# Patient Record
Sex: Female | Born: 1935 | Race: White | Hispanic: No | Marital: Married | Smoking: Former smoker
Health system: Southern US, Community
[De-identification: ages and names within clinical notes are randomized; demographics above are authoritative.]

## PROBLEM LIST (undated history)

## (undated) DIAGNOSIS — M797 Fibromyalgia: Secondary | ICD-10-CM

## (undated) DIAGNOSIS — I10 Essential (primary) hypertension: Secondary | ICD-10-CM

## (undated) DIAGNOSIS — R918 Other nonspecific abnormal finding of lung field: Secondary | ICD-10-CM

## (undated) DIAGNOSIS — F32A Depression, unspecified: Secondary | ICD-10-CM

## (undated) DIAGNOSIS — M1990 Unspecified osteoarthritis, unspecified site: Secondary | ICD-10-CM

## (undated) DIAGNOSIS — E119 Type 2 diabetes mellitus without complications: Secondary | ICD-10-CM

## (undated) DIAGNOSIS — I2510 Atherosclerotic heart disease of native coronary artery without angina pectoris: Secondary | ICD-10-CM

## (undated) DIAGNOSIS — E785 Hyperlipidemia, unspecified: Secondary | ICD-10-CM

## (undated) DIAGNOSIS — F329 Major depressive disorder, single episode, unspecified: Secondary | ICD-10-CM

## (undated) DIAGNOSIS — C50919 Malignant neoplasm of unspecified site of unspecified female breast: Secondary | ICD-10-CM

## (undated) HISTORY — DX: Atherosclerotic heart disease of native coronary artery without angina pectoris: I25.10

## (undated) HISTORY — PX: LUNG REMOVAL, PARTIAL: SHX233

## (undated) HISTORY — PX: CHOLECYSTECTOMY: SHX55

## (undated) HISTORY — PX: REPLACEMENT TOTAL KNEE: SUR1224

## (undated) HISTORY — DX: Essential (primary) hypertension: I10

## (undated) HISTORY — PX: MASTECTOMY: SHX3

## (undated) HISTORY — DX: Other nonspecific abnormal finding of lung field: R91.8

## (undated) HISTORY — DX: Malignant neoplasm of unspecified site of unspecified female breast: C50.919

## (undated) HISTORY — DX: Hyperlipidemia, unspecified: E78.5

---

## 2003-10-02 ENCOUNTER — Ambulatory Visit (HOSPITAL_COMMUNITY): Admission: RE | Admit: 2003-10-02 | Discharge: 2003-10-02 | Payer: Self-pay | Admitting: Neurosurgery

## 2003-10-04 ENCOUNTER — Inpatient Hospital Stay (HOSPITAL_COMMUNITY): Admission: EM | Admit: 2003-10-04 | Discharge: 2003-10-07 | Payer: Self-pay | Admitting: Emergency Medicine

## 2003-10-20 ENCOUNTER — Inpatient Hospital Stay (HOSPITAL_COMMUNITY): Admission: RE | Admit: 2003-10-20 | Discharge: 2003-10-21 | Payer: Self-pay | Admitting: Neurosurgery

## 2003-11-22 ENCOUNTER — Inpatient Hospital Stay (HOSPITAL_COMMUNITY): Admission: RE | Admit: 2003-11-22 | Discharge: 2003-11-28 | Payer: Self-pay | Admitting: Orthopedic Surgery

## 2004-12-11 ENCOUNTER — Ambulatory Visit: Payer: Self-pay | Admitting: Internal Medicine

## 2004-12-16 ENCOUNTER — Ambulatory Visit: Payer: Self-pay | Admitting: Internal Medicine

## 2004-12-16 ENCOUNTER — Ambulatory Visit (HOSPITAL_COMMUNITY): Admission: RE | Admit: 2004-12-16 | Discharge: 2004-12-16 | Payer: Self-pay | Admitting: Internal Medicine

## 2004-12-18 ENCOUNTER — Ambulatory Visit: Admission: RE | Admit: 2004-12-18 | Discharge: 2004-12-18 | Payer: Self-pay | Admitting: Internal Medicine

## 2005-01-30 ENCOUNTER — Encounter (INDEPENDENT_AMBULATORY_CARE_PROVIDER_SITE_OTHER): Payer: Self-pay | Admitting: Specialist

## 2005-01-30 ENCOUNTER — Inpatient Hospital Stay (HOSPITAL_COMMUNITY): Admission: RE | Admit: 2005-01-30 | Discharge: 2005-02-07 | Payer: Self-pay | Admitting: Thoracic Surgery

## 2005-02-04 ENCOUNTER — Ambulatory Visit: Payer: Self-pay | Admitting: Internal Medicine

## 2005-02-12 ENCOUNTER — Encounter: Admission: RE | Admit: 2005-02-12 | Discharge: 2005-02-12 | Payer: Self-pay | Admitting: Thoracic Surgery

## 2005-02-26 ENCOUNTER — Encounter: Admission: RE | Admit: 2005-02-26 | Discharge: 2005-02-26 | Payer: Self-pay | Admitting: Thoracic Surgery

## 2005-03-26 ENCOUNTER — Encounter: Admission: RE | Admit: 2005-03-26 | Discharge: 2005-03-26 | Payer: Self-pay | Admitting: Thoracic Surgery

## 2005-05-21 ENCOUNTER — Ambulatory Visit: Payer: Self-pay | Admitting: Internal Medicine

## 2005-05-21 ENCOUNTER — Encounter: Admission: RE | Admit: 2005-05-21 | Discharge: 2005-05-21 | Payer: Self-pay | Admitting: Thoracic Surgery

## 2005-08-20 ENCOUNTER — Encounter: Admission: RE | Admit: 2005-08-20 | Discharge: 2005-08-20 | Payer: Self-pay | Admitting: Thoracic Surgery

## 2005-11-20 ENCOUNTER — Ambulatory Visit: Payer: Self-pay | Admitting: Internal Medicine

## 2005-11-26 LAB — CBC WITH DIFFERENTIAL/PLATELET
BASO%: 0.7 % (ref 0.0–2.0)
Basophils Absolute: 0.1 10*3/uL (ref 0.0–0.1)
EOS%: 1.9 % (ref 0.0–7.0)
Eosinophils Absolute: 0.2 10*3/uL (ref 0.0–0.5)
HCT: 44.7 % (ref 34.8–46.6)
HGB: 14.7 g/dL (ref 11.6–15.9)
LYMPH%: 33 % (ref 14.0–48.0)
MCH: 27.5 pg (ref 26.0–34.0)
MCHC: 33 g/dL (ref 32.0–36.0)
MCV: 83.5 fL (ref 81.0–101.0)
MONO#: 0.7 10*3/uL (ref 0.1–0.9)
MONO%: 5.5 % (ref 0.0–13.0)
NEUT#: 7.1 10*3/uL — ABNORMAL HIGH (ref 1.5–6.5)
NEUT%: 58.9 % (ref 39.6–76.8)
Platelets: 249 10*3/uL (ref 145–400)
RBC: 5.36 10*6/uL — ABNORMAL HIGH (ref 3.70–5.32)
RDW: 15.7 % — ABNORMAL HIGH (ref 11.3–14.5)
WBC: 12.1 10*3/uL — ABNORMAL HIGH (ref 3.9–10.0)
lymph#: 4 10*3/uL — ABNORMAL HIGH (ref 0.9–3.3)

## 2005-11-26 LAB — COMPREHENSIVE METABOLIC PANEL
ALT: 16 U/L (ref 0–40)
Albumin: 4.1 g/dL (ref 3.5–5.2)
BUN: 19 mg/dL (ref 6–23)
Chloride: 105 mEq/L (ref 96–112)
Glucose, Bld: 215 mg/dL — ABNORMAL HIGH (ref 70–99)

## 2005-12-03 ENCOUNTER — Encounter: Admission: RE | Admit: 2005-12-03 | Discharge: 2005-12-03 | Payer: Self-pay | Admitting: Thoracic Surgery

## 2006-05-12 ENCOUNTER — Ambulatory Visit: Payer: Self-pay | Admitting: Ophthalmology

## 2006-05-17 ENCOUNTER — Ambulatory Visit: Payer: Self-pay | Admitting: Internal Medicine

## 2006-05-19 ENCOUNTER — Ambulatory Visit: Payer: Self-pay | Admitting: Ophthalmology

## 2006-05-20 LAB — COMPREHENSIVE METABOLIC PANEL
ALT: 13 U/L (ref 0–35)
CO2: 24 mEq/L (ref 19–32)
Chloride: 102 mEq/L (ref 96–112)
Glucose, Bld: 366 mg/dL — ABNORMAL HIGH (ref 70–99)
Sodium: 139 mEq/L (ref 135–145)
Total Bilirubin: 0.5 mg/dL (ref 0.3–1.2)

## 2006-05-20 LAB — CBC WITH DIFFERENTIAL/PLATELET
Eosinophils Absolute: 0.3 10*3/uL (ref 0.0–0.5)
HGB: 14.8 g/dL (ref 11.6–15.9)
LYMPH%: 34.7 % (ref 14.0–48.0)
MCH: 28.3 pg (ref 26.0–34.0)
MCHC: 33.2 g/dL (ref 32.0–36.0)
MONO#: 0.5 10*3/uL (ref 0.1–0.9)
MONO%: 5.2 % (ref 0.0–13.0)
NEUT%: 56.6 % (ref 39.6–76.8)
Platelets: 209 10*3/uL (ref 145–400)
RBC: 5.23 10*6/uL (ref 3.70–5.32)

## 2006-05-26 ENCOUNTER — Encounter: Admission: RE | Admit: 2006-05-26 | Discharge: 2006-05-26 | Payer: Self-pay | Admitting: Internal Medicine

## 2006-06-25 IMAGING — CR DG CHEST 2V
2 series · 2 of 2 positions shown · non-contrast
Comparison: 02/04/05.

CLINICAL DATA: Shortness of breath. 
 CHEST - 2 VIEW:

[view not recorded (1 of 2)]
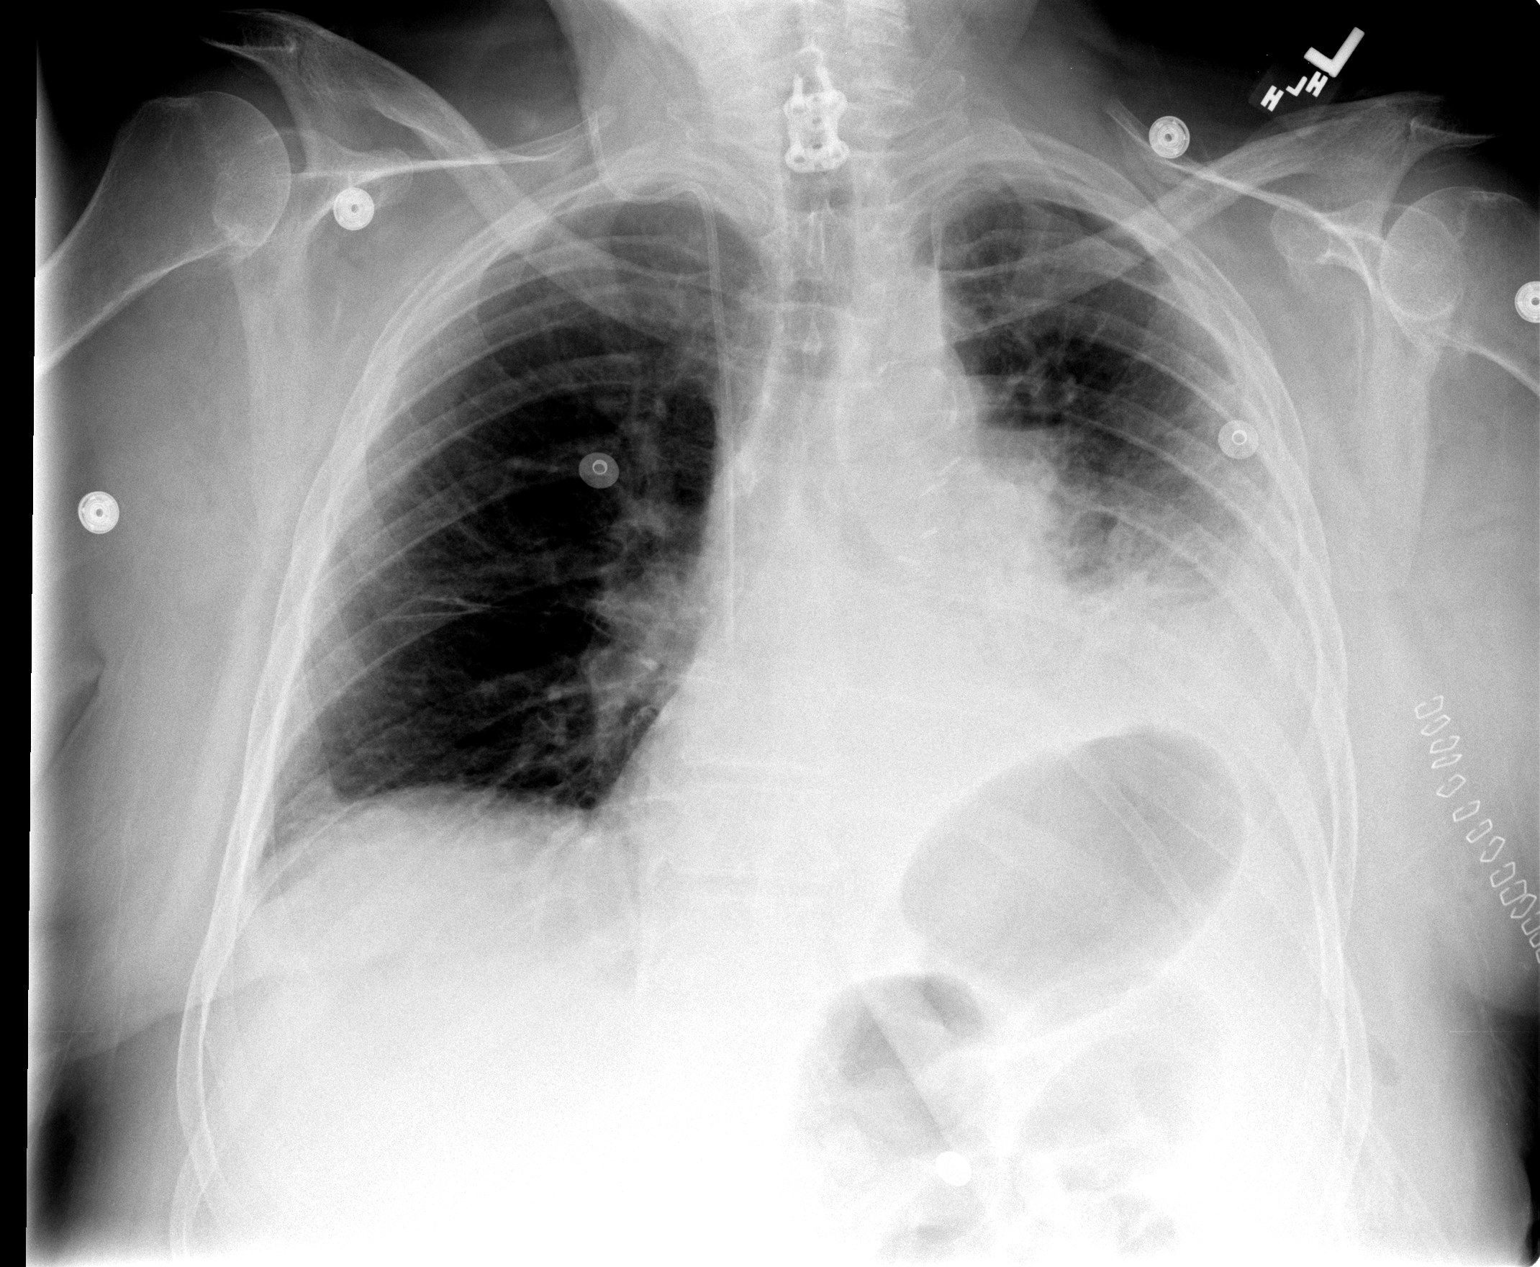

[view not recorded (2 of 2)]
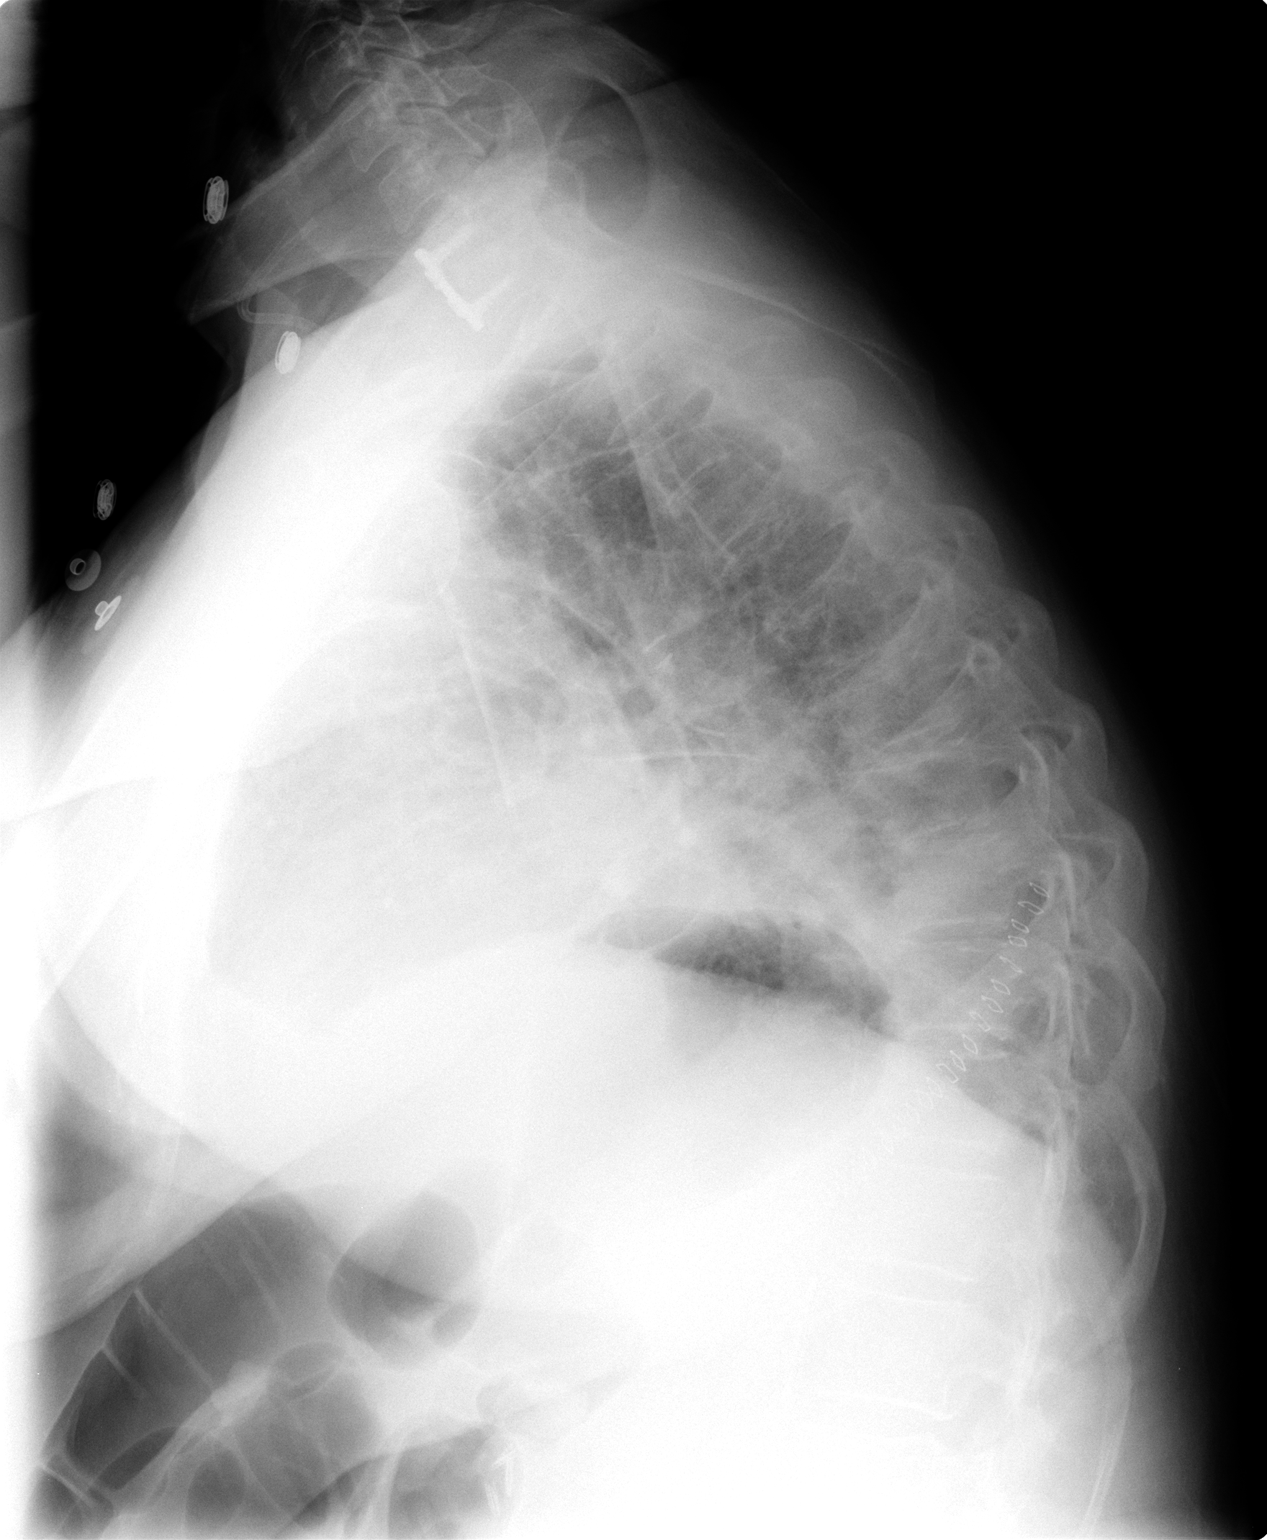

[2 of 2 positions shown; findings below may reference images not displayed]

Post surgical changes secondary to left thoracotomy.  Left hydropneumothorax has decreased. There is improved aeration in left lung.  Mild interstitial edema.  Central catheter tip remains at the SVC/right atrial junction.
IMPRESSION: Decreased left hydropneumothorax with improved aeration.

## 2006-06-27 IMAGING — CR DG CHEST 1V PORT
1 series · 1 of 1 positions shown · non-contrast
Comparison: 02/06/05

CLINICAL DATA: Lung lesion
 PORTABLE CHEST ? ONE VIEW:  02/07/05

[view not recorded]
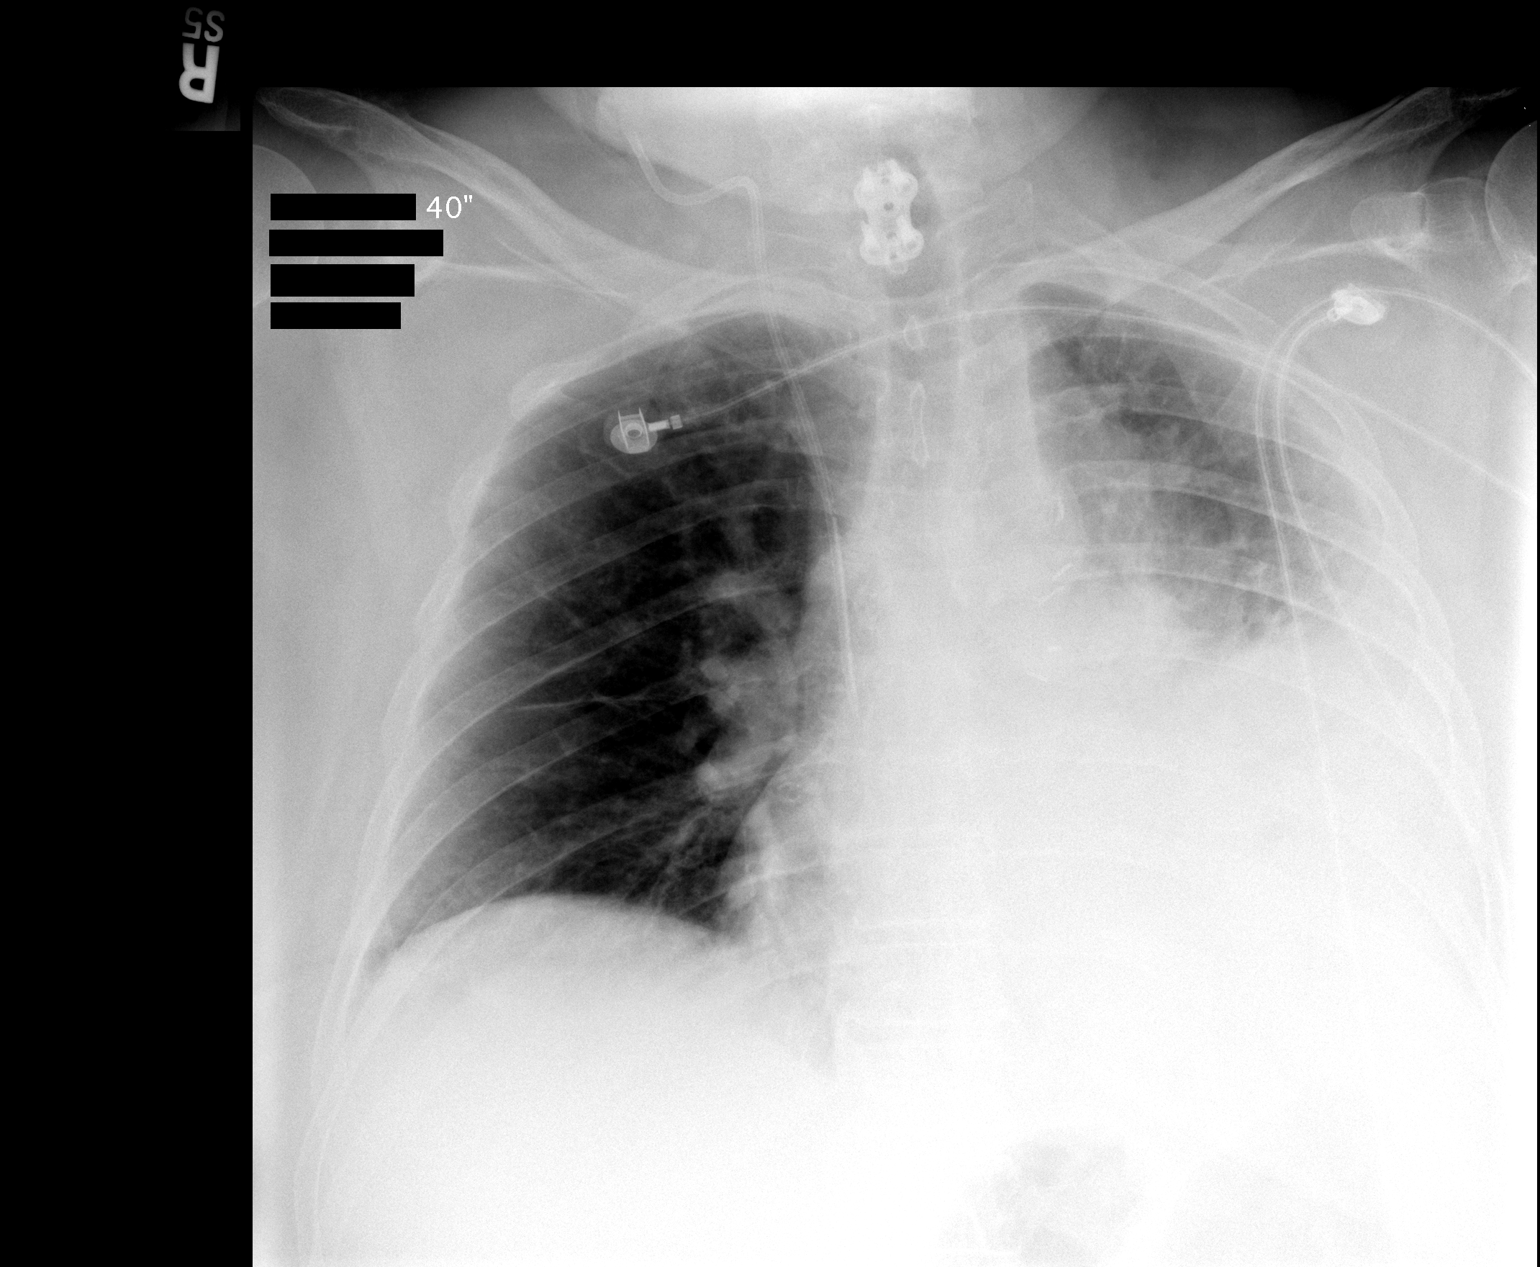

[1 of 1 positions shown; findings below may reference images not displayed]

FINDINGS: There has been an interval increase in the patient?s left effusion and air space disease.  Previously seen small pneumothorax is not visualized on today?s study.  The right lung is clear.
IMPRESSION: Increasing left effusion and air space disease.  Pneumothorax not visualized today.

## 2006-07-10 ENCOUNTER — Ambulatory Visit: Payer: Self-pay | Admitting: Internal Medicine

## 2006-07-15 ENCOUNTER — Ambulatory Visit (HOSPITAL_COMMUNITY): Admission: RE | Admit: 2006-07-15 | Discharge: 2006-07-15 | Payer: Self-pay | Admitting: Internal Medicine

## 2006-07-15 LAB — CBC WITH DIFFERENTIAL/PLATELET
BASO%: 0.7 % (ref 0.0–2.0)
EOS%: 1.9 % (ref 0.0–7.0)
LYMPH%: 28.9 % (ref 14.0–48.0)
MCH: 28.2 pg (ref 26.0–34.0)
MCHC: 33.3 g/dL (ref 32.0–36.0)
MCV: 84.8 fL (ref 81.0–101.0)
MONO%: 4.6 % (ref 0.0–13.0)
NEUT#: 6.6 10*3/uL — ABNORMAL HIGH (ref 1.5–6.5)
Platelets: 210 10*3/uL (ref 145–400)
RBC: 5.04 10*6/uL (ref 3.70–5.32)
RDW: 15.4 % — ABNORMAL HIGH (ref 11.3–14.5)

## 2006-07-15 LAB — COMPREHENSIVE METABOLIC PANEL
AST: 11 U/L (ref 0–37)
Albumin: 4.1 g/dL (ref 3.5–5.2)
Alkaline Phosphatase: 66 U/L (ref 39–117)
Glucose, Bld: 157 mg/dL — ABNORMAL HIGH (ref 70–99)
Potassium: 4.5 mEq/L (ref 3.5–5.3)
Sodium: 141 mEq/L (ref 135–145)
Total Bilirubin: 0.5 mg/dL (ref 0.3–1.2)
Total Protein: 7.3 g/dL (ref 6.0–8.3)

## 2006-07-30 ENCOUNTER — Ambulatory Visit: Payer: Self-pay | Admitting: Ophthalmology

## 2006-08-04 ENCOUNTER — Ambulatory Visit: Payer: Self-pay | Admitting: Ophthalmology

## 2006-08-13 IMAGING — CR DG CHEST 2V
2 series · 2 of 2 positions shown · non-contrast
Comparison: 02/26/05.

CLINICAL DATA: Lung lesion with recent left lung surgery.
 TWO VIEW CHEST:

[w chest pa]
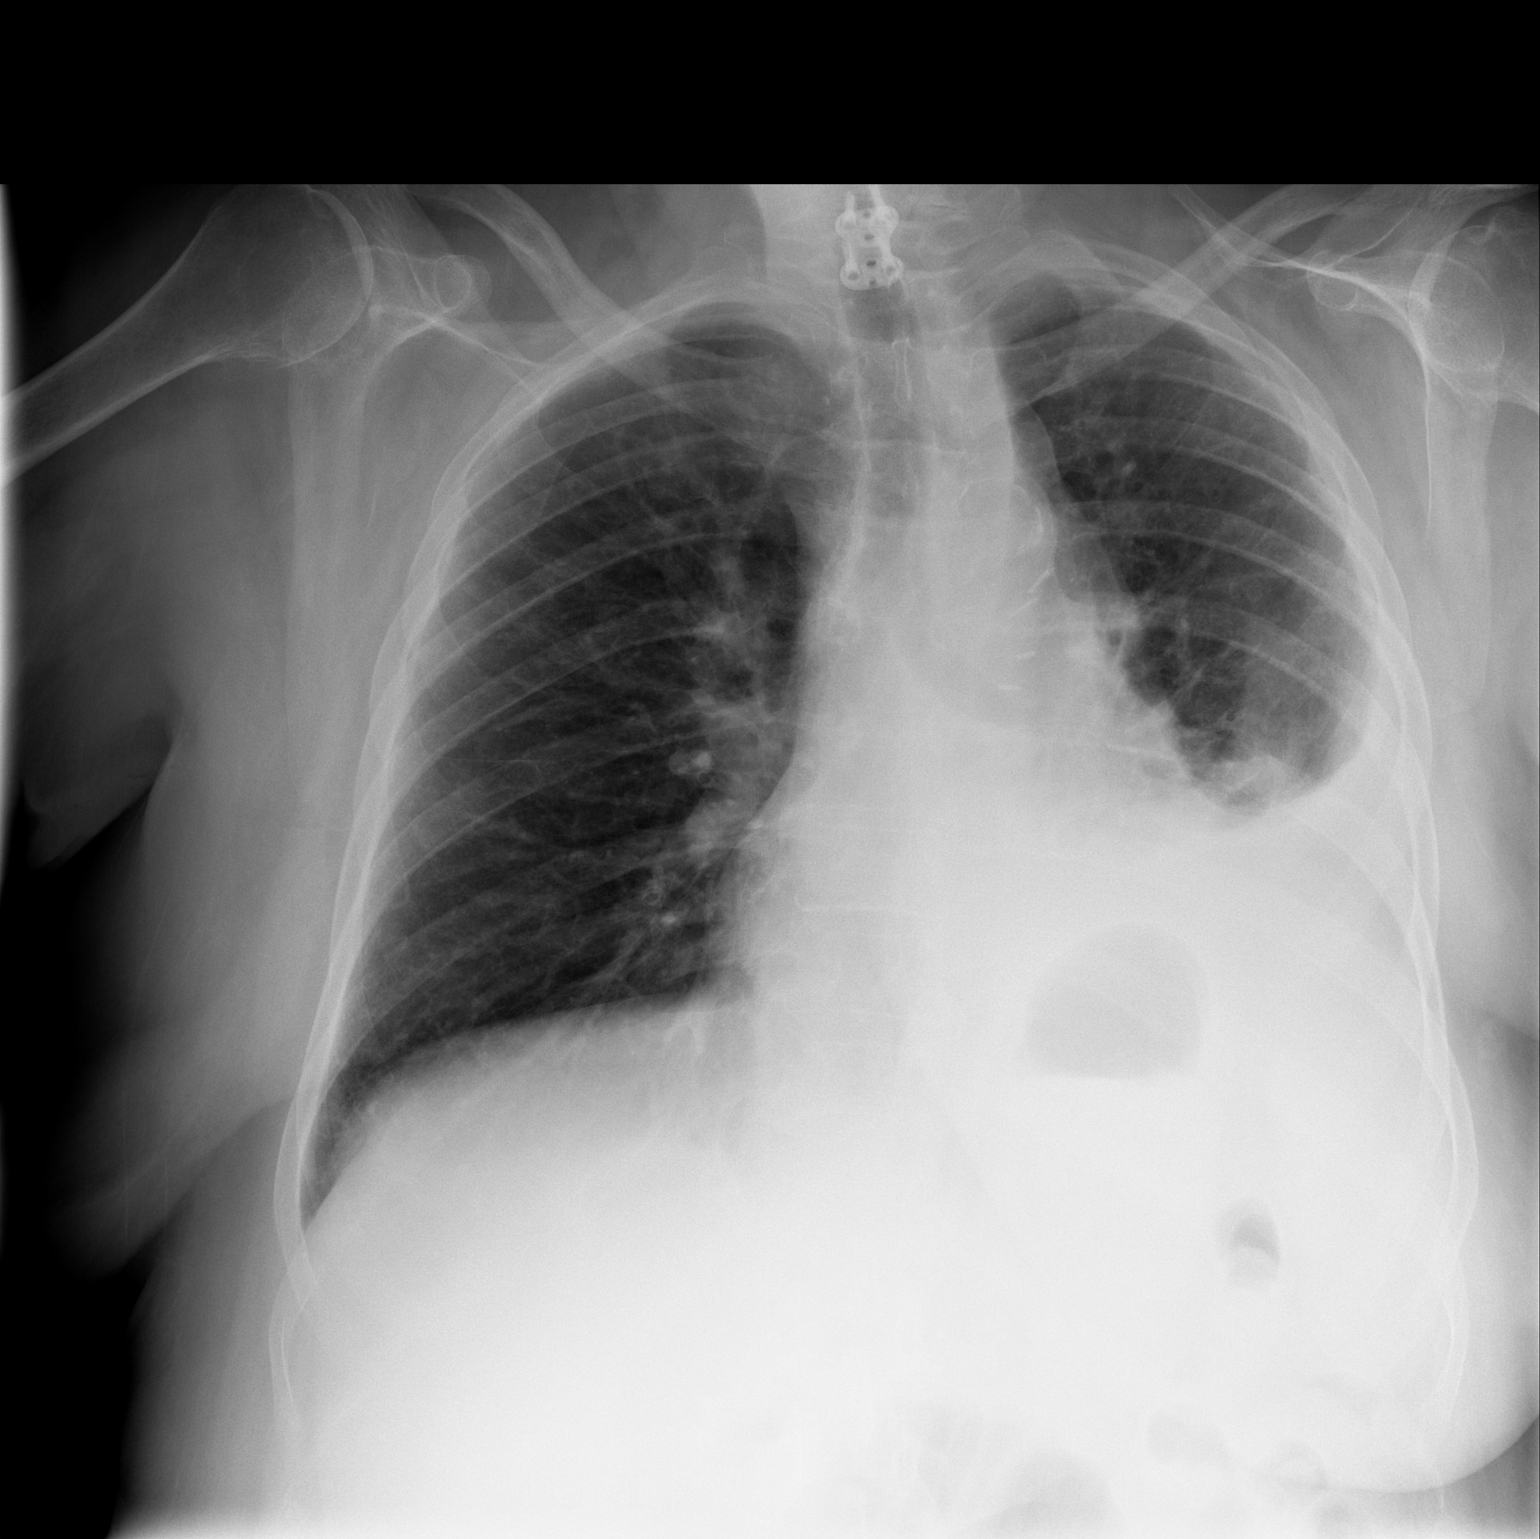

[w chest lat]
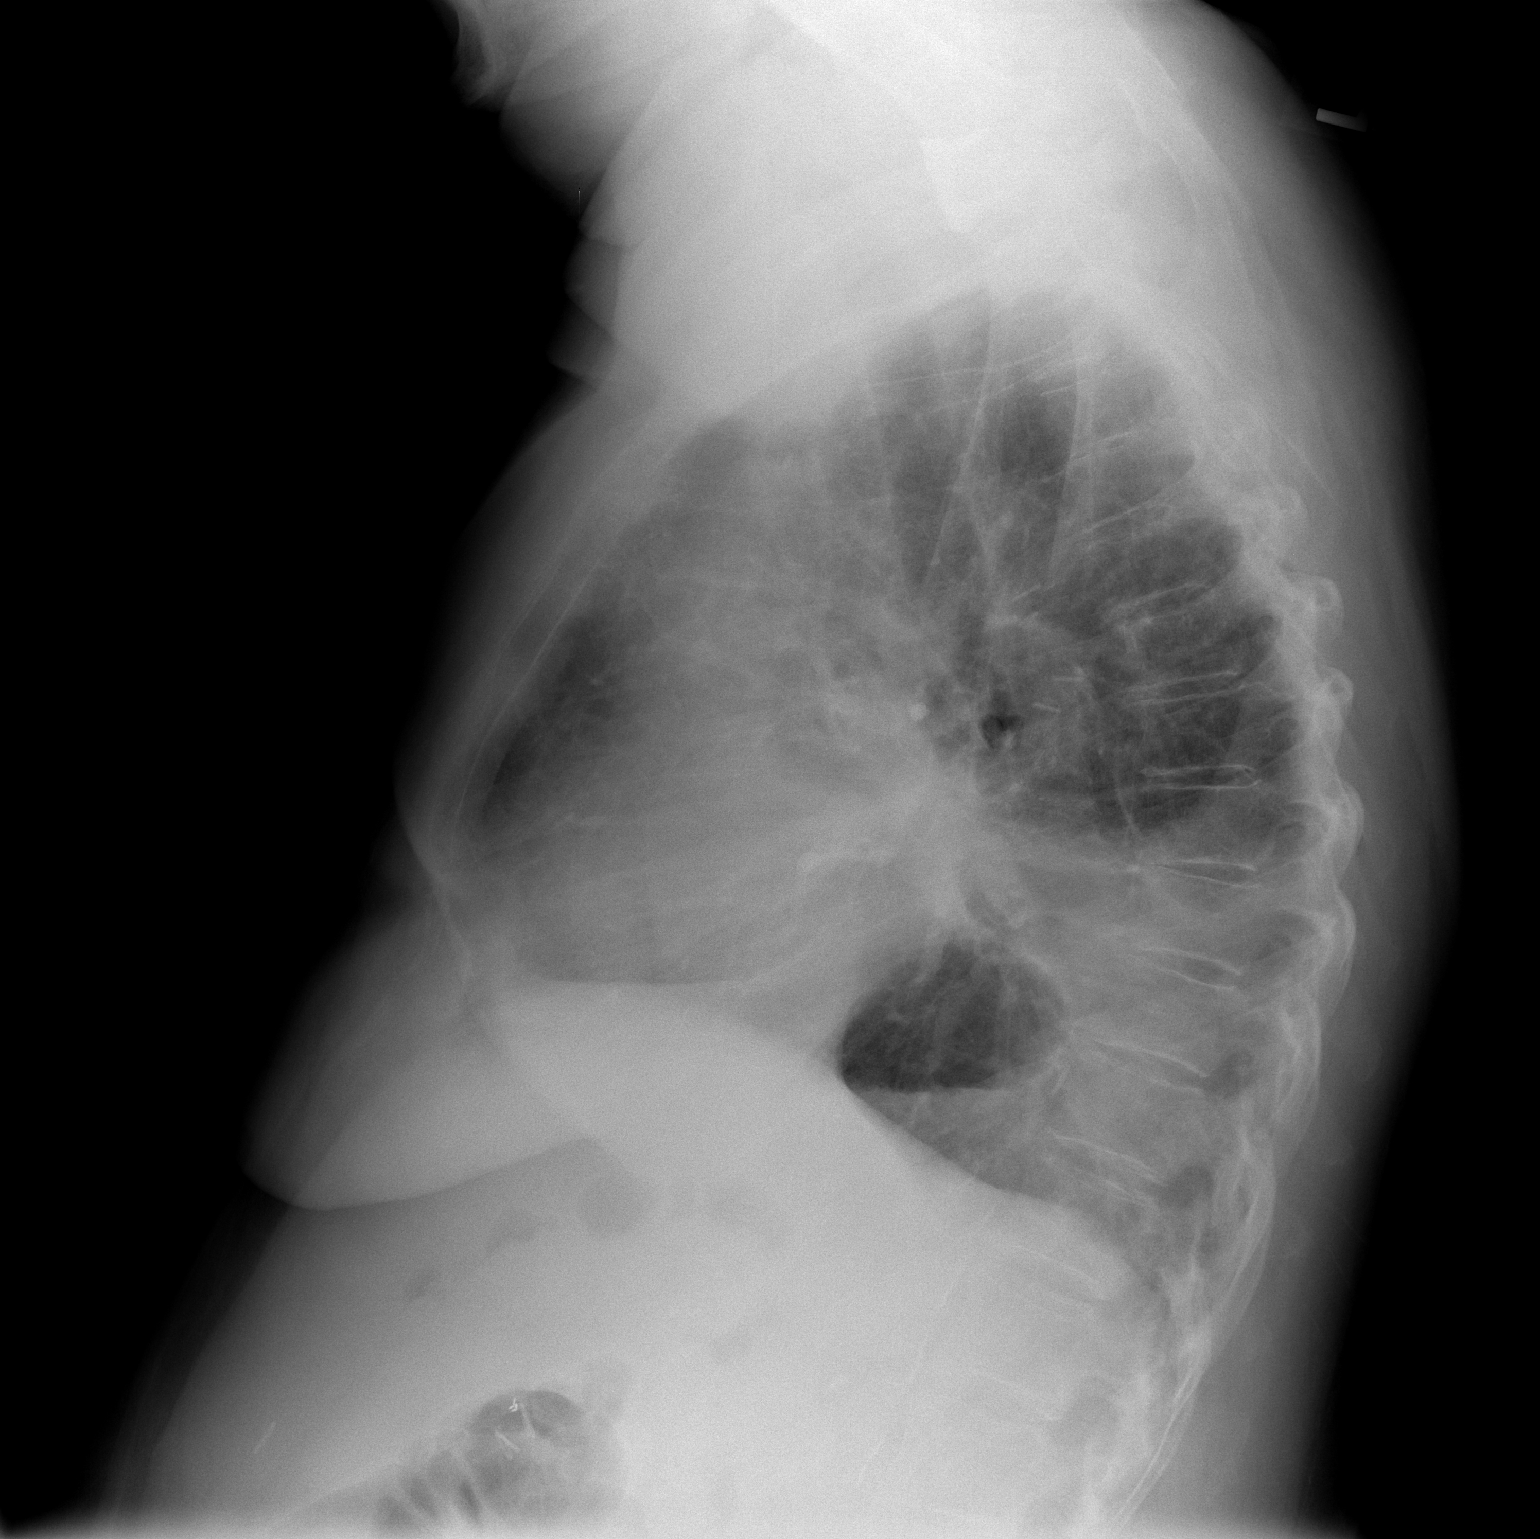

[2 of 2 positions shown; findings below may reference images not displayed]

Left base collapse/consolidation with left effusion is unchanged in the interval.   The right lung remains clear.  The cardiopericardial silhouette is stable.
IMPRESSION: No substantial change in the left base collapse/consolidation with associated effusion.

## 2006-10-22 ENCOUNTER — Ambulatory Visit: Payer: Self-pay | Admitting: Ophthalmology

## 2006-10-28 ENCOUNTER — Ambulatory Visit: Payer: Self-pay | Admitting: Ophthalmology

## 2007-01-11 ENCOUNTER — Ambulatory Visit: Payer: Self-pay | Admitting: Internal Medicine

## 2007-01-13 LAB — CBC WITH DIFFERENTIAL/PLATELET
Basophils Absolute: 0 10*3/uL (ref 0.0–0.1)
EOS%: 1.9 % (ref 0.0–7.0)
Eosinophils Absolute: 0.2 10*3/uL (ref 0.0–0.5)
LYMPH%: 27 % (ref 14.0–48.0)
MCH: 28.3 pg (ref 26.0–34.0)
MCV: 84.7 fL (ref 81.0–101.0)
MONO%: 5.5 % (ref 0.0–13.0)
NEUT#: 7.4 10*3/uL — ABNORMAL HIGH (ref 1.5–6.5)
Platelets: 203 10*3/uL (ref 145–400)
RBC: 5.16 10*6/uL (ref 3.70–5.32)
RDW: 16 % — ABNORMAL HIGH (ref 11.3–14.5)

## 2007-01-13 LAB — COMPREHENSIVE METABOLIC PANEL
AST: 14 U/L (ref 0–37)
Alkaline Phosphatase: 74 U/L (ref 39–117)
BUN: 16 mg/dL (ref 6–23)
Glucose, Bld: 342 mg/dL — ABNORMAL HIGH (ref 70–99)
Sodium: 142 mEq/L (ref 135–145)
Total Bilirubin: 0.7 mg/dL (ref 0.3–1.2)

## 2007-01-15 ENCOUNTER — Ambulatory Visit (HOSPITAL_COMMUNITY): Admission: RE | Admit: 2007-01-15 | Discharge: 2007-01-15 | Payer: Self-pay | Admitting: Internal Medicine

## 2007-07-14 ENCOUNTER — Ambulatory Visit (HOSPITAL_COMMUNITY): Admission: RE | Admit: 2007-07-14 | Discharge: 2007-07-14 | Payer: Self-pay | Admitting: Internal Medicine

## 2007-07-14 ENCOUNTER — Ambulatory Visit: Payer: Self-pay | Admitting: Internal Medicine

## 2007-07-14 LAB — CBC WITH DIFFERENTIAL/PLATELET
Basophils Absolute: 0 10*3/uL (ref 0.0–0.1)
Eosinophils Absolute: 0.2 10*3/uL (ref 0.0–0.5)
HGB: 15.1 g/dL (ref 11.6–15.9)
LYMPH%: 32 % (ref 14.0–48.0)
MCH: 28 pg (ref 26.0–34.0)
MCV: 83.4 fL (ref 81.0–101.0)
MONO%: 6.9 % (ref 0.0–13.0)
NEUT#: 6.3 10*3/uL (ref 1.5–6.5)
NEUT%: 58.9 % (ref 39.6–76.8)
Platelets: 172 10*3/uL (ref 145–400)

## 2007-07-14 LAB — COMPREHENSIVE METABOLIC PANEL
Albumin: 3.5 g/dL (ref 3.5–5.2)
Alkaline Phosphatase: 61 U/L (ref 39–117)
BUN: 8 mg/dL (ref 6–23)
Creatinine, Ser: 0.78 mg/dL (ref 0.40–1.20)
Glucose, Bld: 116 mg/dL — ABNORMAL HIGH (ref 70–99)
Potassium: 4.3 mEq/L (ref 3.5–5.3)
Total Bilirubin: 0.7 mg/dL (ref 0.3–1.2)

## 2008-08-09 ENCOUNTER — Ambulatory Visit: Payer: Self-pay | Admitting: Internal Medicine

## 2008-08-11 ENCOUNTER — Ambulatory Visit (HOSPITAL_COMMUNITY): Admission: RE | Admit: 2008-08-11 | Discharge: 2008-08-11 | Payer: Self-pay | Admitting: Internal Medicine

## 2009-08-09 ENCOUNTER — Ambulatory Visit: Payer: Self-pay | Admitting: Internal Medicine

## 2009-08-13 ENCOUNTER — Ambulatory Visit (HOSPITAL_COMMUNITY): Admission: RE | Admit: 2009-08-13 | Discharge: 2009-08-13 | Payer: Self-pay | Admitting: Internal Medicine

## 2009-08-13 LAB — CBC WITH DIFFERENTIAL/PLATELET
BASO%: 0.5 % (ref 0.0–2.0)
Basophils Absolute: 0 10*3/uL (ref 0.0–0.1)
Eosinophils Absolute: 0.2 10*3/uL (ref 0.0–0.5)
HGB: 14.8 g/dL (ref 11.6–15.9)
LYMPH%: 35.7 % (ref 14.0–49.7)
MCHC: 33.1 g/dL (ref 31.5–36.0)
MCV: 82.3 fL (ref 79.5–101.0)
MONO#: 0.5 10*3/uL (ref 0.1–0.9)
RBC: 5.43 10*6/uL (ref 3.70–5.45)

## 2009-08-13 LAB — COMPREHENSIVE METABOLIC PANEL
ALT: 18 U/L (ref 0–35)
Albumin: 3.7 g/dL (ref 3.5–5.2)
Alkaline Phosphatase: 58 U/L (ref 39–117)
BUN: 8 mg/dL (ref 6–23)
CO2: 31 mEq/L (ref 19–32)
Chloride: 105 mEq/L (ref 96–112)
Potassium: 3.4 mEq/L — ABNORMAL LOW (ref 3.5–5.3)
Sodium: 145 mEq/L (ref 135–145)
Total Bilirubin: 0.8 mg/dL (ref 0.3–1.2)
Total Protein: 7.5 g/dL (ref 6.0–8.3)

## 2010-07-21 ENCOUNTER — Encounter: Payer: Self-pay | Admitting: Internal Medicine

## 2010-07-21 ENCOUNTER — Encounter: Payer: Self-pay | Admitting: Thoracic Surgery

## 2010-11-15 NOTE — Discharge Summary (Signed)
[name], [name]                           ACCOUNT NO.:  [id number]   MEDICAL RECORD NO.:  [id number]                   PATIENT TYPE:  INP   LOCATION:  0457                                 FACILITY:  [facility]   PHYSICIAN:  Georges Lynch. Darrelyn Hillock, M.D.             DATE OF BIRTH:  [birth year]   DATE OF ADMISSION:  11/22/2003  DATE OF DISCHARGE:  11/28/2003                                 DISCHARGE SUMMARY   ADMISSION DIAGNOSES:  1. Degenerative arthritis right knee.  2. Hypertension.  3. Hiatal hernia.  4. Gastroesophageal reflux disease.  5. History of urinary tract infections.  6. Insulin dependent diabetes.  7. History of breast cancer.   DISCHARGE DIAGNOSES:  1. Status post right total knee arthroplasty.  2. Hypertension.  3. Hiatal hernia.  4. Gastroesophageal reflux disease.  5. History of previous urinary tract infections.  6. Insulin dependent diabetes.  7. History of breast cancer.   PROCEDURE:  The patient was taken to the operating room on Nov 22, 2003 to  undergo a right total knee arthroplasty. Surgeon Georges Lynch. Darrelyn Hillock, M.D.,  assistant [name]. Paitsel, P.A.  The surgery was performed under general  anesthesia.  A Hemovac drain was placed at the time of the surgery.   CONSULTATIONS:  1. [employer].  2. PT.  3. OT.   LABORATORY DATA:  Admission CBC, WBC 10.1, RBC 4.63, hemoglobin 12.1,  hematocrit 37.4, platelets 233.  Admission PT 13.4, INR 1.0, PTT 30.  Admission chemistries, sodium 144, potassium 4.1, chloride 109, CO2 30,  glucose 151, elevated BUN 15, creatinine 0.9, albumin 13.4. Admission  urinalysis, cloudy urine, small amount of bilirubin, trace ketones, small  leukocytes esterase.  Microscopic exam revealed 0-2 WBC's per high powered  field, few yeast seen.  The patient's blood type is A negative, negative  antibody screen.  Doppler study to rule out DVT Nov 27, 2003 negative.   Preoperative x-ray of right knee tricompartmental degenerative  changes.  Postoperative x-ray of right knee revealed right total knee replacement  without complications.  I am unable to locate the preoperative EKG or chest  x-ray in the patient's medical record.   BRIEF HISTORY:  A 77 year old female whose had right knee pain for the past  several years. The pain has been increasing over the past few months making  it very difficult for her to ambulate. The patient was evaluated in the  office and found to have advanced degenerative changes in the right knee and  the patient elected to proceed with a right total knee arthroplasty. The  patient was admitted to [facility] for same.   HOSPITAL COURSE:  The patient was admitted to [facility], she was  taken to the operating room on Nov 22, 2003 to undergo a right total knee  arthroplasty.  The patient tolerated the procedure well and was allowed to  return to the recovery  room and then to the orthopedic floor to continue  postoperative care. [facility] was consulted to help manage the  patient's numerous medical problems.  The patient's hemoglobin and  hematocrit was followed throughout her hospitalization, she did have a  postoperative drop in her hemoglobin.  On postoperative day two, the patient  did receive two units of packed red blood cells, hemoglobin stabilized.  The  patient had a Hemovac drain that was pulled on postoperative day one. PT was  consulted for gait training ambulation, the patient was a little slow to  progress and it was felt that she could benefit from a stay in the [facility]  or  rehab department. The patient continued to work with the therapist and she  was ambulate greater than 100 feet. When it was felt that she could be  discharged home safely, she would be discharged home in the care of her  daughter.   DISPOSITION:  The patient was discharged home on Nov 28, 2003.   DISCHARGE MEDICATIONS:  Coumadin 5 mg daily, Percocet 5/325, 1 or 2 every 4   hours as needed for pain.   ACTIVITY:  Full weightbearing with walker.   DIET:  No restrictions.   WOUND CARE:  Daily dressing changes.   FOLLOW UP:  The patient is scheduled to followup with Dr. Darrelyn Hillock in the  office one week from the date of discharge.   CONDITION ON DISCHARGE:  Stable.     [name]. Paitsel, P.A.                     Ronald A. Darrelyn Hillock, M.D.    Tilden Dome  D:  01/02/2004  T:  01/02/2004  Job:  [id number]

## 2010-11-15 NOTE — H&P (Signed)
[name], [name]               ACCOUNT NO.:  [id number]   MEDICAL RECORD NO.:  [id number]          PATIENT TYPE:  INP   LOCATION:  NA                           FACILITY:  [facility]   PHYSICIAN:  Ines Bloomer, M.D. DATE OF BIRTH:  [birth year]   DATE OF ADMISSION:  01/30/2005  DATE OF DISCHARGE:                                HISTORY & PHYSICAL   CHIEF COMPLAINT:  Left lower lobe mass.   This 77 year old Caucasian female was being evaluated for lower abdominal  pain and a CT scan showed a left lower lobe mass.  She had had a history of  some weakness and some weight loss.  On CT scan, a chest CT showed a 1.6  left lower lobe lesion with two perihilar lymph nodes.  PET scan was  positive in the lesion but negative in the lymph node.  She quit smoking 23  years ago, does get shortness of breath with exertion.  Her FVC is 3.20 and  FEV1 of 2.53 with a diffusion capacity of 94.  She had no hemoptysis,  excessive sputum.  No history of asthma or wheezing. She was seen by Dr.  Sherene Sires and resection was recommended.   PAST MEDICAL HISTORY:  1.  Diabetes.  2.  She has been treated for morbid obesity in the past.   MEDICATIONS:  1.  __________  2.  Glucophage.  3.  Maxzide.  She recently stopped the Maxzide.  4.  Isordil.   ALLERGIES:  No known drug allergies.   PAST MEDICAL HISTORY:  1.  Appendectomy.  2.  Previous back surgery.  3.  Treated for hypertension.   FAMILY HISTORY:  Negative for vascular disease.   She still has severe back pain and will have back surgery in the future.   SOCIAL HISTORY:  She is married and has one child.  She is self-employed.  Does not smoke or drink at the present time.   REVIEW OF SYSTEMS:  She has had some weight loss.  She is 240 pounds, 5 feet  5 inches.  CARDIAC:  She has no angina or atrial fibrillation, shortness of  breath on exertion.  PULMONARY:  As in history of present illness.  GI:  She  has had no nausea, vomiting, constipation,  dysphagia, or GERD.  GU:  No  dysuria or frequent urination.  VASCULAR:  She denies claudication, DVT or  phlebitis.  NEUROLOGIC:  She has no headaches or seizures.  ORTHOPEDICS:  She complains of arthritis and multiple pain.  She has been told she has  fibromyalgia.  PSYCHIATRIC:  Denies any depression.  EENT:  No changes in  eye sight or hearing.  No problems with anemia.  SKIN:  She has been treated  for shingles in the past and has had an intermittent rash under her breast,  particularly her left breast.   PHYSICAL EXAMINATION:  GENERAL APPEARANCE:  She is a slightly obese  Caucasian female in no acute distress.  VITAL SIGNS:  Her blood pressure is 140/84, pulse 105, respirations 18,  saturations 94%.  HEENT:  Head is atraumatic.  Eyes:  Pupils are equal, round, reactive to  light and accommodation.  Extraocular movements are normal.  Ears:  Tympanic  membranes are intact.  Nose:  There is no septal deviation.  Throat:  Without lesions.  NECK:  There is no supraclavicular or axillary adenopathy.  CHEST:  Clear to auscultation and percussion.  CARDIOVASCULAR:  Regular sinus rhythm, no murmurs.  ABDOMEN:  Obese.  There is no hepatosplenomegaly.  Pulses are 2+.  He does  have a small periumbilical hernia that is really more like a ventral hernia  below her umbilicus.  EXTREMITIES:  There are 2+ pulses but 1+ edema with some varicosities.  NEUROLOGIC:  She is oriented x3.  Cranial nerves II-XII  intact.  Sensory  and motor intact.  SKIN:  She has a Candida rash under her left breast.   IMPRESSION:  1.  Left lower lobe mass.  2.  Diabetes mellitus.  3.  Morbid obesity.  4.  Hypertension.  5.  Chronic back pain.   PLAN:  Left VATS and resection of left lower lobe.       DPB/MEDQ  D:  01/28/2005  T:  01/29/2005  Job:  [id number]

## 2010-11-15 NOTE — Op Note (Signed)
[name], [name]               ACCOUNT NO.:  [id number]   MEDICAL RECORD NO.:  [id number]          PATIENT TYPE:  INP   LOCATION:  3310                         FACILITY:  [facility]   PHYSICIAN:  Ines Bloomer, M.D. DATE OF BIRTH:  1931/05/11   DATE OF PROCEDURE:  DATE OF DISCHARGE:                                 OPERATIVE REPORT   PREOPERATIVE DIAGNOSIS:  Left lower lobe mass.   POSTOPERATIVE DIAGNOSIS:  Adenocarcinoma, left lower lobe.   PROCEDURE:  Left VATS, left thoracotomy, left lower lobectomy and node  dissection.   SURGEON:  Ines Bloomer, M.D.   FIRST ASSISTANT:  Coral Ceo, P.A.-C.   ANESTHESIA:  General.   DESCRIPTION OF PROCEDURE:  After percutaneous insertion of monitoring lines,  the patient underwent anesthesia and was turned to the right lateral  thoracotomy position.  She was prepped and draped in the usual sterile  manner.   A dual-lumen tube was inserted.  Two trocar sites were inserted.  The  patient had an incomplete fissure.  A lesion was seen in the posterior base  of the left lower lobe.  A 3 to 4-cm incision was made over the sixth  intercostal space, and the latissimus was partially divided.  The serratus  was reflected anteriorly, and the intercostal muscle was divided right over  the sixth rib.  The lesion was identified and wedged out with EZ45 stapler,  with three to four applications.  Frozen section revealed adenocarcinoma.  We went ahead and did a completion left lower lobectomy, taking down the  inferior pulmonary ligament with electrocautery and then dissecting out the  inferior pulmonary vein and looping it with a vascular tape.   Attention was then turned to the fissure which was very incomplete.  Before  doing this, the posterior mediastinum was dissected out, dissecting out  several 11r and 10r nodes, and then the anterior mediastinum was dissected  out up to the superior pulmonary vein.  Attention was then turned to the  fissure.  Dissection was carried down into  the fissure, but we had to really divide through lung with electrocautery to  get down to the pulmonary artery.  The pulmonary artery was finally  identified, and the superior portion of the fissure was divided with the  EZ45 stapler.  This exposed the superior segmental branches, and they were  dissected out carefully, looped with vascular tapes, and stapled and divided  with #2 Autosuture, 30 right reticular.  There were two branches of the  superior pulmonary artery.  Then the basilar branches were dissected out,  preserving the lingular branch to the left upper lobe.  This was then  stapled and divided with the [facility] stapler.  Several more 11l nodes  were dissected from around the bronchus.  The inferior portion of the  fissure skin not complete had to be divided with the Echelon 60 stapler and  the EZ45 stapler.  The fissure was finally divided.  The inferior pulmonary  vein was stapled and divided with the Autosuture stapler, and then the  bronchus was dissected off and stapled and divided  with the EZ45 stapler.  All nodes were dissected free.  There was an air leak from the fissure line,  and this had to be oversewn with 3-0 Vicryl in interrupted fashion, and then  CoSeal was applied to the staple line.  An intercostal muscle flap was taken  off of the fifth rib with electrocautery and then sutured to the bronchus  with 2-0 silk to cover the bronchus and the area around the left lower lobe  bronchus.  Two chest tubes were brought in through separate stab wounds.  Prior to doing that though, the On-Q catheter was placed using the  thoracoscope subpleurally in the usual fashion and were secured in place  with Steri-Strips.  A Marcaine block was done in the usual fashion.  The  chest was closed with four pericostals of #2 Vicryl, #1 Vicryl in the muscle  layer, 2-0 Vicryl in the subcutaneous tissue, and Ethicon skin clips.   The patient  was returned to the recovery room in stable condition.       DPB/MEDQ  D:  01/30/2005  T:  01/31/2005  Job:  [id number]

## 2010-11-15 NOTE — Discharge Summary (Signed)
NAMEDANELY, BAYLISS NO.:  [id number]   MEDICAL RECORD NO.:  [id number]                   PATIENT TYPE:  INP   LOCATION:  5153                                 FACILITY:  [facility]   PHYSICIAN:  Alvester Morin, M.D.               DATE OF BIRTH:  1930-11-01   DATE OF ADMISSION:  10/04/2003  DATE OF DISCHARGE:  10/07/2003                                 DISCHARGE SUMMARY   DISCHARGE DIAGNOSES:  1. SIRS.  2. Lower extremity cellulitis.  3. Diabetes mellitus.  4. Leukocytosis.  5. Anemia.  6. Hypertension.  7. Gastroesophageal reflux disease.   DISCHARGE MEDICINES:  1. Aspirin 81 mg p.o. daily.  2. Maxzide one tablet p.o. daily.  3. Glucophage 1 gm one tablet b.i.d.  4. Atenolol 25 mg p.o. b.i.d.  5. Neurontin 600 mg p.o. q.h.s.  6. Isordil 20 mg one tablet t.i.d.  7. Vicodin 5/500 two tabs p.o. q.6h. p.r.n. for pain.  8. Nexium 40 mg p.o. daily.  9. Lexapro.  10.      Tamoxifen 10 mg p.o. b.i.d.  11.      Augmentin XR two tablets q.12h. with meals, 14-day supply.   DISPOSITION:  The patient was to follow up with Dr. Theophilus Bones at [facility] on Friday, October 13, 2003 at 2:00 p.m.   PROCEDURES:  1. Portable chest x-ray on October 04, 2003.  Impression - minimal bibasilar     atelectasis.  2. Portable chest x-ray on October 05, 2003.  Impression - bibasilar     atelectasis versus developing infiltrates.  3. PA and lateral chest x-ray on October 06, 2003.  Impression - no significant     interval change in atelectasis, and question left base infiltrate.  4. Lower extremity venous duplex imaging on October 05, 2003.  Impression - no     evidence of DVT, superficial thrombosis, or Baker's cyst bilaterally.   BRIEF HISTORY AND PHYSICAL:  A 77 year old white female with a past medical  history of diabetes, hypertension, fibromyalgia, who was scheduled for  anterior cervical surgery, and was instead brought to the ED after not  feeling well last  night, complaining of weakness plus lethargy.  The patient  awoke, but denies chest pain, shortness of breath, abdominal pain, dysuria,  and respiratory symptoms.  No diarrhea or constipation (but no bowel  movement since last Saturday, 4 days prior to admission).  No hematemesis,  melena, or hematochezia.  No urinary symptoms.  No leg swelling.  Positive  aches all over, but noting different than usual fibromyalgia pains.   PHYSICAL EXAMINATION:  VITAL SIGNS:  Pulse 100, blood pressure 88/67, but  132/50 after 250 cc of normal saline, temperature 102.2, respiratory rate  22, O2 saturation 98% on room air.  GENERAL:  Mildly distressed, but cannot pinpoint any particular location.  HEENT:  Eyes - pupils equal, round and reactive to  light and accommodation.  Extraocular muscles intact and icteric.  ENT revealed dry mucous membranes.  Clear OP.  NECK:  Supple.  No lymphadenopathy, no JVD.  LUNGS:  Clear to auscultation.  Decreased bilateral breath sounds in the  bases.  No wheezes.  Expiratory phase normal.  CV:  Tachycardic, regular rate and rhythm.  A 2/6 systolic ejection murmur  at the left upper sternal border.  GI:  Positive bowel sounds.  Markedly obese.  Nontender, nondistended.  Skin  breakdown within skin folds.  EXTREMITIES:  Left lower extremity erythema.  The patient states this is  old.  SKIN:  Dry.  MUSCULOSKELETAL:  Generalized weakness.  NEUROLOGIC:  Alert and oriented x4.   LABORATORY DATA:  Sodium 139, potassium 3.2, chloride 103, bicarb 22, BUN  21, creatinine 1.2, glucose 444.  CBC with white count of 23.6, with an ANC  of 21.5.  Hemoglobin 12.4, with an MCV of 78.  Chest x-ray with minimal  bibasilar atelectasis.  UA with greater than 1000 glucose, 15 ketones.  Blood cultures x2.  EKG ST with LAE.  Lactic acid 3.1.  ABG with pH of  7.445, PCO2 of 37.5, PO2 of 63, bicarb 25.8.  Cardiac enzymes CK total 24,  #2 79 CK-MB, #1 of 0.5, #2 of  0.08.  Troponin I #1 of  0.02, #2 of 0.02.   HOSPITAL COURSE:  1. The patient was admitted for possible SIRS.  This was thought to be     likely secondary to her pulmonary versus lower extremity cellulitis     source.  The patient was hydrated and put on Zosyn, and her SIRS symptoms     resolved.  2. Lower extremity erythema.  This was thought to be likely secondary to     cellulitis versus deep vein thrombosis.  Deep vein thrombosis was ruled     out with negative Doppler studies.  She was continued on Zosyn and     improved.  3. Mental status changes.  The patient was slightly confused on admission.     This resolved with hydration and antibiotics.  4. Anion gap.  The patient admitted with an anion gap of 14.  This may have     been secondary to early diabetic ketoacidosis versus lactic acidosis, but     resolved by day #2 of hospitalization after starting her on her insulin     and hydrating.  5. Diabetes mellitus.  The patient was put on Glucomander protocol.  Her     sugars were well controlled throughout her hospitalization, and she was     discharged on her home regimen including Glucophage 1 gm p.o. b.i.d.  6. The patient was found to be hypokalemic on admission.  This was thought     to be secondary to decreased magnesium versus diuretic use versus     insulin.  She was given KCl elixir 40 mEq x2 doses, and her hyperkalemia     resolved.  Her magnesium level was within normal limits.  7. Leukocytosis.  The patient admitted with a high white count.  This was     thought to be secondary to an infectious process, resolved status post     day #2 of Zosyn.  8. Anemia.  The patient was found to be anemic with a hemoglobin of 10.9 on     day #2 of hospitalization.  Ferritin, B-12, RBC, folate, and iron panel     was checked.  Her B-12 was 440.  Her RBC folate was 404.  Her ferritin     was 63.  Her iron was low at 11, percent saturation of 4, and TIBC of    268.  This was thought to be secondary to chronic  disease, but should be     followed by the outpatient clinic to evaluate the necessity for further     work-up.  9. Hypertension.  The patient's blood pressure remained stable throughout     her hospitalization.  She was discontinued with her home medications     including Maxzide and atenolol.   DISPOSITION:  By October 07, 2003, the patient's chief complaints had resolved,  and she was discharged with medicines as described, and follow up with her  primary care physician.      Tawny Asal, MD                   Alvester Morin, M.D.    CW/MEDQ  D:  11/09/2003  T:  11/10/2003  Job:  [id number]   cc:   [facility]

## 2010-11-15 NOTE — Consult Note (Signed)
NAMESUSEN, HASKEW               ACCOUNT NO.:  [id number]   MEDICAL RECORD NO.:  [id number]          PATIENT TYPE:  INP   LOCATION:  3310                         FACILITY:  [facility]   PHYSICIAN:  Lajuana Matte, MD  DATE OF BIRTH:  1930-09-12   DATE OF CONSULTATION:  02/03/2005  DATE OF DISCHARGE:                                   CONSULTATION   REASON FOR CONSULTATION:  Lung cancer.   HISTORY OF PRESENT ILLNESS:  Ms. Pearce is a pleasant 77 year old female  found to have an incidental left lower lobe mass as she was being evaluated  for unexplained 60 pound weight loss for the period of 1 year, despite good  appetite. A CT scan of the abdomen revealed a 1.6 cm left lower lobe lesion,  with 2 large perihilar lymph nodes, measuring 2 x 1 cm and 3 x 1 cm,  worrisome for primary bronchogenic carcinoma versus metastatic disease. PET  scan was positive in the left lower lobe on December 13, 2004 with a maximum SUV  of 7.0, non-specific metabolic activity in the right ovary was seen as well,  and in the ureters, which was followed by an endometrial biopsy by [facility]. The report of that biopsy is currently not available. On January 23, 2005,  she was seen by Dr. Edwyna Shell, who recommended VATS resection of the mass,  scheduled for January 30, 2005. This revealed a stage 1-A T1N0MX non-small  cell lung carcinoma. We were asked to see the patient for recommendations  regarding her care.   PAST MEDICAL HISTORY:  1.  History of breast cancer status post right mastectomy, with negative      lymph nodes, and therapy consisting of Tamoxifen for 5 years, last dose      on May of 2006.  2.  Fibromyalgia.  3.  Osteoarthritis.  4.  Diabetes mellitus.  5.  Hypertension.  6.  Remote history of tobacco abuse.  7.  Gastroesophageal reflux disease.  8.  Known iron deficiency anemia.  9.  Morbid obesity.  10. Chronic back pain.  11. Diabetic neuropathy.  12. Umbilical hernia.   PAST SURGICAL  HISTORY:  1.  Status post left video-assisted thoracic surgery, left lower lobectomy,      and node dissection by Dr. Edwyna Shell January 30, 2005.  2.  Status post appendectomy.  3.  Status post laminectomy 2005.  4.  Status post right mastectomy in [place] in 2001.  5.  Status post tonsillectomy.  6.  Status post right knee replacement, Dr. Darrelyn Hillock.   ALLERGIES:  No known drug allergies.   CURRENT MEDICATIONS:  1.  Dulcolax daily.  2.  Zinacef 1.5 grams q. 12 hours IV.  3.  Neurontin 600 mg q.h.s.  4.  NovoLog and Novolin as directed.  5.  Isosorbide dinitrate 20 mg b.i.d.  6.  Glucophage 1,000 mg daily.  7.  Nystatin topically b.i.d.  8.  K-Dur 20 meq t.i.d.  9.  Percocet and Darvocet p.r.n.  10. Ultram and morphine sulfate p.r.n.   REVIEW OF SYSTEMS:  Remarkable for intermittent fever  and night sweats. She  also has a weight loss of 60 pounds over the last year, without anorexia. No  mental status changes. She does have dyspnea on exertion and no sputum  production in her chronic cough. No chest pain or palpitations. The rest of  the review of systems is negative.   FAMILY HISTORY:  Mother died with heart disease. Father died of unknown  causes. One sister deceased with bone cancer.   SOCIAL HISTORY:  The patient is married. She has 1 grown child in good  health. She is self-employed. No tobacco for 30 years. No alcohol history.  She lives in [place]. She is Control and instrumentation engineer.   PHYSICAL EXAMINATION:  GENERAL:  This is a 77 year old obese white female in  no acute distress. Alert and oriented times three.  VITAL SIGNS:  Stable. Afebrile. Weight 247 pounds. Height 68 inches.  HEENT:  Normocephalic and atraumatic. Pupils are equal, round, and reactive  to light and accommodation. Oral mucosa without thrush or lesions.  NECK:  Supple. No supraclavicular or cervical masses.  LUNGS:  With decreased breath sounds on the left, distant sounds, the right  essentially clear to auscultation.  No axillary masses.  BREAST:  Right side mastectomy. No chest wall tenderness or masses  appreciated. Left breast without masses.  CARDIOVASCULAR:  Regular rate and rhythm. Without murmur, rub, or gallop.  ABDOMEN:  Obese, non-tender. Umbilical hernia is seen. Bowel sounds active.  No palpable spleen or liver.  GENITOURINARY:  Deferred.  RECTAL:  Deferred.  EXTREMITIES:  Without clubbing, cyanosis. Minimal edema bilaterally.  SKIN:  Without lesions, bruising, or petechiae. There are diabetic changes  in both legs, chronic.  NEUROLOGIC:  Non-focal.   LABORATORY DATA:  Hemoglobin 10.3, hematocrit 30.6. White count 11.7.  Platelets 226,000. MCV 85.8. PT 12.9. PTT 29. INR 1.0. Sodium 138, potassium  low at 3.1. BUN 10, creatinine 0.6, glucose 90, total bilirubin 1.5,  alkaline phosphatase 71, AST 40, ALT 26, total protein 5.7, albumin 2.8,  calcium 9.2.   ASSESSMENT/PLAN:  Dr. Arbutus Ped has seen and evaluated the patient and the  chart has been reviewed. This is a pleasant 77 year old white female who was  recently diagnosed with stage 1-A (T1,N0,MX) non-small cell lung carcinoma  status post left lower lobe lobectomy with node dissection and pathology  revealing a 1.7 cm adenocarcinoma with negative nodes for malignancy. There  is no survival benefit for adjuvant chemotherapy or radiation therapy for  stage 1-A non-small cell lung carcinoma. Dr. Arbutus Ped recommends close  followup with CT of the chest in 6 months. If the patient has no evidence of  disease recurrence or metastasis in 6 months after surgery,  she may be considered for a chemotherapy prevention trial with Selenium  versus placebo.  Dr. Arbutus Ped will arrange a followup appointment for her  with him at the [facility] in 2 to 3 months after discharge.   Thank you very much for allowing [place] the opportunity to participate in Mrs.  Abdella's care.      SW/MEDQ  D:  02/04/2005  T:  02/04/2005  Job:  [id number]   cc:    Charlaine Dalton. Sherene Sires, M.D. [facility]  [address]  [place]  [place] [address]

## 2010-11-15 NOTE — Op Note (Signed)
[name], [name]                           ACCOUNT NO.:  [id number]   MEDICAL RECORD NO.:  [id number]                   PATIENT TYPE:  INP   LOCATION:  0457                                 FACILITY:  [facility]   PHYSICIAN:  [name], M.D.             DATE OF BIRTH:  1930/12/27   DATE OF PROCEDURE:  11/22/2003  DATE OF DISCHARGE:                                 OPERATIVE REPORT   SURGEON:  [name], M.D.   ASSISTANT:  [name]. Paitsel, P.A.-C.   PREOPERATIVE DIAGNOSIS:  Severe degenerative arthritis of the right knee.   POSTOPERATIVE DIAGNOSIS:  Severe degenerative arthritis of the right knee.   OPERATION/PROCEDURE:  Right total knee arthroplasty.  The sizes used were as  follows:  The patella was a size 36 recessed patella.  The femur was a size  7 right posterior cruciate, stabilized component.  The tibial tray was a  size 7 tray.  The insert was a 10 mm thickness size 7 Flex insert.  All  three components were cemented and gentamicin was mixed in the cement.   DESCRIPTION OF PROCEDURE:  Under general anesthesia, routine orthopedic prep  and drape of the right lower extremity was carried out.  The patient was  given 1 g of IV Ancef.  Leg was exsanguinated with an Esmarch.  The  tourniquet was elevated to 325 mmHg.  An anterior approach of the knee was  carried out.  Two flaps were created and reflected.  I then carried out a  median parapatellar incision, reflected the patella laterally, flexed the  knee and did lateral meniscectomies.  I then excised the anterior and  posterior cruciate ligaments.  Once we had good exposure, initial drill hole  was made in the intercondylar notch.  The #1 jig was inserted and 10 mm  thickness was removed from the distal femur.  At this particular time, I  then went on and prepared the tibial burst.  I then removed a 4 mm thickness  off the tibia in the usual fashion and measurements were taken from the  medial side.  We did  utilize the intramedullary guide rods.  Once this was  prepared, we then went on and released our preparation of the femur for a  size 7 femur.  I did an anterior, posterior and chamfering cuts.  Note this  was done for posterior cruciate stabilized knee.  Following this, I then  went on and made my notch cuts in the usual fashion in the femur as well as  the patellar notch cut.  I then went through trials.  We utilized the size 7  femoral component, size 7 tibial tray with a 10 mm thickness insert.  We had  excellent stability and excellent motion.  I then removed the trials and  then cut my keel cut in the usual fashion in the proximal tibial metaphysis.  This was  done after we removed 10 mm thickness off the articular surface of  the patella for a size 26 patella.  I also made three drill holes in the  articular surface of the patella after we finished the cut.  Thoroughly  waterpicked the out the knee, dried the knee out and cemented all three  components in simultaneously.  Great care was taken to make sure we removed  all loose pieces of cement.  Vancomycin was used as in the cement to  highlight that.  After the knee was thoroughly irrigated, I went through  trials again.  I utilized a 10 mm thickness trial insert.  It fit quite  nicely and it was stable.  I removed that, then inserted a permanent 10 mm  thickness, size 7 Flex tibial tray.  I then reduced the knee, took the knee  through a motion.  I did a lateral release and I inserted the Hemovac drain  and took the knee through motion in the usual  fashion.  After the knee was closed, we had good stability, good motion and  main part of the knee was closed in layers in the usual fashion.  Sterile  dressings were applied.  The patient left the operating room in satisfactory  condition.                                               [name], M.D.    RAG/MEDQ  D:  11/22/2003  T:  11/22/2003  Job:  [id number]   cc:    Rosanne Sack, M.D.  [address]  [place], [place] [address]  Fax: [phone]

## 2010-11-15 NOTE — Op Note (Signed)
[name], [name]                           ACCOUNT NO.:  [id number]   MEDICAL RECORD NO.:  [id number]                   PATIENT TYPE:  INP   LOCATION:  3012                                 FACILITY:  [facility]   PHYSICIAN:  Coletta Memos, M.D.                  DATE OF BIRTH:  [birth year]   DATE OF PROCEDURE:  10/20/2003  DATE OF DISCHARGE:  10/21/2003                                 OPERATIVE REPORT   PREOPERATIVE DIAGNOSIS:  1. Displaced disk left C6-7.  2. Left C7 radiculopathy.   POSTOPERATIVE DIAGNOSIS:  1. Displaced disk left C6-7.  2. Left C7 radiculopathy.   OPERATION PERFORMED:  Anterior cervical decompression, C6-7; arthrodesis, C6-  7 with anterior plating and 7 mm allograft.   SURGEON:  Coletta Memos, M.D.   ASSISTANT:  Hewitt Shorts, M.D.   ANESTHESIA:  General.   COMPLICATIONS:  None.   INDICATIONS FOR PROCEDURE:  [name] is a 77 year old woman who  presents for evaluation of pain, both arms, especially on the left side.  She has a large herniation on the left side at C6-7 which I believe was  causing her pain.  I therefore recommended and she agreed to undergo  operative decompression.   DESCRIPTION OF PROCEDURE:  [name] was brought to the operating room,  intubated and placed under general anesthetic without difficulty.  Her head  was positioned neutrally on a horseshoe headrest with 10 pounds of traction  applied via chin strap.  Her neck ws prepped and she was draped in sterile  fashion.  I infiltrated approximately 3 mL of 0.5 lidocaine 1:200,000  strength with epinephrine starting at the midline extending to the medial  border of the left sternocleidomastoid muscle on the left side approximately  two fingerbreadths above the sternal notch.  I opened the skin with a #10  blade and took that down to the platysma.  I then dissected rostrally and  caudally above the platysma.  I opened the platysma in a horizontal fashion.  I then dissected  rostrally and caudally inferior to the platysma.  I  identified the medial strap muscles, the omohyoid and the  sternocleidomastoid.  Taking the omohyoid laterally along to the  sternocleidomastoid muscle and carotid, I then created an avascular plane to  the anterior cervical spine.  I took multiple x-rays until I was able to  confirm my location.  This was since she was very broad shouldered.  I then  placed two distraction pins, one in C6, one in C7 after placing self-  retaining Caspar retractors overlying the disk space of C6-7.  After placing  the distraction pins, I then distracted the disk space used a 15 blade and  continued to cut disk material.  I used curets to scrape the end plates.  I  then used a high speed air drill to remove some small osteophytes.  I was  able to actually do this mainly using Kerrison punches.  I did remove a  large amount of disk material which was obviously compressing the thecal  sac.  I then was able to decompress both C7 nerve roots quite well.  I  irrigated the wound.  I then placed a 7 mm allograft.  The weight was  removed from the neck and the distraction pins were removed.  I then placed  a 24 mm Trinica plate overlying the disk space.  Self-  drilling 14 mm screws were used, two at C6, two at C7.  The locking plate  was then engaged.  I did not try another x-ray as I had never been able to  see 6-7 clearly.  I then irrigated the wound.  I then closed the wound in a  layered fashion, reapproximating the platysma.  Then the subcutaneous  tissues.  Dermabond used for a sterile dressing.                                               Coletta Memos, M.D.    KC/MEDQ  D:  10/20/2003  T:  10/22/2003  Job:  [id number]

## 2010-11-15 NOTE — Discharge Summary (Signed)
[name], LUTE NO.:  [id number]   MEDICAL RECORD NO.:  [id number]                   PATIENT TYPE:  INP   LOCATION:  5153                                 FACILITY:  [facility]   PHYSICIAN:  Alvester Morin, M.D.               DATE OF BIRTH:  07/25/1930   DATE OF ADMISSION:  10/04/2003  DATE OF DISCHARGE:  10/07/2003                                 DISCHARGE SUMMARY   DISCHARGE DIAGNOSES:  1. Systemic inflammatory response syndrome (SIRS).  2. Lower extremity cellulitis.  3. Abnormal chest x-ray.  4. Diabetes mellitus.  5. Leukocytosis.  6. Anemia.  7. Acute renal insufficiency.  8. Hypertension.  9. Gastroesophageal reflux disease.   DISCHARGE MEDICATIONS:  1. Aspirin 81 mg p.o. q.d.  2. Maxzide one tablet p.o. q.d.  3. Glucophage 1 g p.o. b.i.d.  4. Atenolol 25 mg p.o. b.i.d.  5. Neurontin 600 mg p.o. q.h.s.  6. Isordil 20 mg one tablet t.i.d.  7. Vicodin 5/500 two tablets p.o. q.6h. p.r.n. for pain.  8. Nexium 40 mg p.o. q.d.  9. Lexapro.  10.      Tamoxifen 10 mg p.o. b.i.d.  11.      Augmentin XR two tablets p.o. b.i.d. for 14 days.   DISPOSITION:  The patient is to follow up with Dr. Theophilus Bones at [facility] on Friday, October 13, 2003 at 2 p.m.   PROCEDURE:  1. Portable chest x-ray on October 04, 2003; impression:  Minimal bibasilar     atelectasis.  2. Portable chest x-ray on October 05, 2003; impression:  Bibasilar atelectasis     versus developing infiltrates.  3. PA and lateral chest x-ray on October 06, 2003; impression:  No significant     interval change in bibasilar atelectasis, question left basilar     infiltrate.  4. Lower extremity venous Doppler; impression:  No evidence of DVT,     superficial thrombosis, or Baker's cyst bilaterally.   BRIEF HISTORY OF PRESENT ILLNESS:  Uncompiled by Dr. Sula Soda Debsikdar. [name] is a 77 year old white female with past medical history of diabetes  mellitus,  hypertension, fibromyalgia, coronary artery disease, breast cancer  status post mastectomy in 2001. Presents with general malaise and body  aches. She was scheduled for anterior cervical surgery but instead was  brought to ED after not feeling well last night, complaining of weakness and  lethargy. The patient awake, denies chest pain, shortness of breath,  abdominal pain, dysuria, respiratory symptoms. No diarrhea or constipation  (but no bowel movement since last Saturday four days prior to admission). No  hemoptysis. No hematemesis. No melena. No hematochezia. No urinary symptoms.  No leg swelling. Positive aches all over but states nothing different than  usual fibromyalgia pains.   PHYSICAL EXAMINATION:  VITAL SIGNS:  Pulse 100, blood pressure 88/67-then  132/50 after 250 cc of normal saline, temperature 102.2, respiratory  rate  22, O2 saturation 98% on room air.  GENERAL:  Mild distress but cannot pinpoint any particular location.  HEENT:  Eyes:  Pupils are equal, round, and reactive to light and  accommodation. Extraocular movements intact, anicteric. ENT:  Dry mucous  membranes, clear oropharynx.  NECK:  Supple. No lymphadenopathy. No JVP.  LUNGS:  Clear but decreased in bilateral bases. No wheezing. Normal  expiratory phase.  CARDIOVASCULAR:  Tachy, regular, 2/6 systolic ejection murmur second left  upper sternal border.  GASTROINTESTINAL:  Positive bowel sounds. Morbidly obese, nontender,  nondistended.  Skin breakdown within the skin folds.  EXTREMITIES:  Left lower extremity erythema. The patient states that this is  old, nontender, no warmth.  SKIN:  Dry.  MUSCULOSKELETAL:  General weakness, 4/5.  NEUROLOGICAL:  Alert and oriented x4.   LABORATORY DATA:  Sodium 139, potassium 3.2, chloride 103, bicarb 22, BUN  21, creatinine 1.2, glucose 444. CBC:  White count 3.6, ANC 21.5, hemoglobin  of 12.4, MCV of 78, platelets 220. UA:  Significant for greater than 4,000  glucose,  50 ketones. Chest x-ray as described in procedure section. ABG:  PH  7.445, pCO2 37.5, pO2 63, bicarb 25.8. PT 14.7, PTT 31.   ASSESSMENT:  1. SIRS, thought to be likely secondary to pulmonary versus lower extremity     cellulitis source. The patient improved significantly after hydration and     Zosyn.  2. Lower extremity erythema, possibly likely secondary to cellulitis versus     DVT. DVT ruled out venous Dopplers. The patient responded well to Zosyn.  3. Mental status changes. The patient presented with mild confusion that     resolved after hydration and antibiotics. It was thought that this could     be likely secondary to infection.  4. Abnormal chest x-ray. Concern for infiltrate. Patient started on Zosyn     for broad coverage. By April 9, the patient was stable, afebrile, feeling     well. It was thought that she was stable to be discharged on Augmentin     with a 14-day course, and follow up with her primary care physician.  5. Diabetes mellitus. The patient admitted and started on Glucophage b.i.d.     Blood sugars were kept in a reasonable range. She was discharged with her     Glucophage 1 g b.i.d.  6. Leukocytosis. The patient admitted with high white count that resolved     secondary to antibiotics. It was thought that this was likely secondary     to pulmonary process such as pneumonia or cellulitis, both of which were     treated with course of Zosyn. Furthermore, the patient was discharged     with 14-day course of Augmentin.  7. Anemia. The patient admitted with low hemoglobin. Ferritin was found to     be within normal limits. The patient states  __________ colonoscopy. Dr.     Theophilus Bones should follow up to ensure that patient has been worked up     thoroughly for this anemia including colonoscopy, if one has not been     performed.  8. Hypertension. The patient admitted with mild hypotension that resolved    after hydration. The patient's blood pressure was  maintained in a     reasonable range on Maxzide, and she was discharged with her home dose,     as well as atenolol and Isordil.  9. Gastroesophageal reflux disease. The patient was started on Protonix in  the hospital but discharged on her home dose of Nexium (40 mg p.o. q.d.).     By April 9, the patient was much better. It was felt that she was stable     to be discharged to home with close followup with her primary care     physician, Dr. Theophilus Bones, at [facility].   DISCHARGE LABORATORY DATA:  WBCs 7.7, RBCs 4.57, hemoglobin 12.1, hematocrit  36.3, MCV 79.4, MCHC 33.2, RDW 60.7, platelets 207. Sodium 137, potassium  3.3 (replaced with KCl elixir 40 mEq x1 dose, then repeated dose in 4  hours), chloride 106, CO2 27, glucose 174, BUN 6, creatinine 0.8, calcium 8.      Tawny Asal, MD                   Alvester Morin, M.D.    CW/MEDQ  D:  11/21/2003  T:  11/23/2003  Job:  [id number]   cc:   Venetia Maxon  [address]  [place]  [place] [address]  Fax: [phone]

## 2010-11-15 NOTE — Discharge Summary (Signed)
[name], [name]               ACCOUNT NO.:  [id number]   MEDICAL RECORD NO.:  [id number]          PATIENT TYPE:  INP   LOCATION:  3310                         FACILITY:  [facility]   PHYSICIAN:  Ines Bloomer, M.D. DATE OF BIRTH:  February 08, 1931   DATE OF ADMISSION:  01/30/2005  DATE OF DISCHARGE:  02/07/2005                                 DISCHARGE SUMMARY   ADMISSION DIAGNOSIS:  Left lower lobe mass.   PAST MEDICAL HISTORY/DISCHARGE DIAGNOSES:  1.  History of breast cancer status post right mastectomy with negative      lymph nodes and therapy consisting of Tamoxifen x5 years.  Last does in      May 2006.  2.  Fibromyalgia.  3.  Osteoarthritis.  4.  Diabetes mellitus.  5.  Hypertension.  6.  Remote history of tobacco abuse.  7.  GERD.  8.  Iron deficiency anemia.  9.  Morbid obesity.  10. Chronic back pain.  11. Diabetic neuropathy.  12. Umbilical hernia.  13. Left lower lobe mass status post left video assisted fluoroscopic      surgery, left lower lobectomy and node dissection.  Pathology revealing      stage IA T1N0MX non-small cell lung carcinoma.   ALLERGIES:  No known drug allergies.   BRIEF HISTORY:  The patient is a 77 year old Caucasian female who was  instantly found to have a left lower lobe mass as she was being evaluated  for an unexplained 60 pound weight loss over the period of a year.  A CT  scan of the abdomen revealed a 1.6 cm left lower lobe lesion with two large  perihilar lymph nodes.  These were worrisome for primary bronchogenic  carcinoma versus metastatic disease from previous breast cancer.  A PET scan  was performed in June 2006 which was positive in the left lower lobe with a  maximum SUV of 7.0.  There was also nonspecific metabolic activity in the  right ovary, as well as, in the ureters.  This was followed by [facility].  On January 23, 2005, the patient was evaluated by Dr. Edwyna Shell who recommended  resection of the left lower lobe mass.   HOSPITAL COURSE:  The patient was admitted and taken to the OR on January 30, 2005, for a left video assisted fluoroscopic surgery, left thoracotomy, left  lower lobectomy and left node dissection.  Pathology revealed stage IA  T1N0MX non-small cell lung cancer.  The patient tolerated the procedure well  and was hemodynamically stable immediately postoperatively.  The patient was  transferred from the OR to the postoperative intensive care unit in stable  condition.  The patient was extubated without complication and workup from  anesthesia neurologically intact.   The patient's postoperative course has progressed relatively uncomplicated.  Her primary issue throughout the postoperative course have been mobility.  On postoperative day #1, the patient's normal complaint was of soreness.  She was afebrile with stable vital signs.  Chest x-ray revealed a decrease  in her pneumothorax with increased consolidation to the left face.  There  was no leak present  in the chest tube drain.  The patient's diabetes  mellitus has been managed postoperative with p.o. Glucophage as well as  sliding scale insulin and Novolin 70/30.  She will continue on her home dose  of Metformin after discharge, and her Novolin insulin 70/30 has been altered  somewhat.  She will need close follow up with her primary care physician  regarding this issue.   Invasive lines and chest tubes were discontinued in a routine manner without  complication.  As previously stated, her primary issue has been mobility  throughout the postoperative course.  Occupational therapy and physical  therapy were both consulted and worked with the patient during the  postoperative course.  She has progressed substantially and is currently  able to ambulate with the use of a walker independently.   Dr. Arbutus Ped of the Hematology/Oncology Service consulted on February 03, 2005,  secondary to the patient's diagnoses.  It was his recommendation that  the  patient be followed closely with CT of the chest in six month as there is no  evidence for survival benefit or adjuvant chemotherapy or radiation therapy  for stage IA non-small cell lung carcinoma.  The patient will be contacted  by Dr. Arbutus Ped as an outpatient for follow up.  On postoperative day 7, the  patient's x-ray is stable.  Her wounds are healing well and she is  ambulating well also.  She is afebrile with stable vital signs, and as long  as she continues to progress in the current manner, she will be stable for  discharge in the next 1-2 days pending morning round reevaluation.   CONDITION ON DISCHARGE:  Stable.   LABORATORY DATA:  CBC on February 06, 2005, hemoglobin 10.2, hematocrit 30.3,  white count 11.6, platelets 348,000.  BNP on February 04, 2005, sodium 140,  potassium 3.5, BUN 7, creatinine 0.7, glucose 77.   DISCHARGE MEDICATIONS:  1.  Tylox 1-2 q.4-6h. p.r.n. pain.  2.  Isosorbide 20 mg b.i.d.  3.  Neurontin 600 mg q.h.s.  4.  Metformin 1000 mg daily.  5.  Insulin Novolin 70/30 32 units q.a.m., 35 units q.p.m.   DISCHARGE INSTRUCTIONS:  1.  Diet:  Low-salt, low-fat diet with carbohydrate modified medium calorie      for her diabetes.  2.  Activity:  No driving for two weeks.  No lifting for three weeks.  The      patient should continue daily breathing and walking exercises.  3.  Wound Care:  The patient may shower and clean the incisions daily with      soap and water.  If wound problems arise, the patient is to contact CVTS      office at [phone].   FOLLOWUP:  1.  Follow up appointment with [facility] for PA/Lateral chest x-ray      on February 12, 2005, at 1:00 p.m.  2.  Follow up with Dr. Edwyna Shell on February 12, 2005, at 2:00 p.m.  3.  Follow up with Dr. Arbutus Ped.  His office will contact the patient with      the date and time of her appointment.      Aman  AY/MEDQ  D:  02/06/2005  T:  02/07/2005  Job:  [id number]   cc:   Lajuana Matte, MD   Fax: [phone]

## 2010-11-15 NOTE — H&P (Signed)
[name], [name]                           ACCOUNT NO.:  [id number]   MEDICAL RECORD NO.:  [id number]                   PATIENT TYPE:  INP   LOCATION:  NA                                   FACILITY:  [facility]   PHYSICIAN:  Georges Lynch. Darrelyn Hillock, M.D.             DATE OF BIRTH:  [name]   DATE OF ADMISSION:  DATE OF DISCHARGE:                                HISTORY & PHYSICAL   ANTICIPATED DATE OF ADMISSION:  For surgery Nov 22, 2003.   HISTORY:  The patient has had right knee pain for the past several years.  Pain has been increasing over the past few months.  It is making it  difficult for her to ambulate.  She is noted to use oxycodone for pain but  is it still quite painful.  The patient was evaluated in the office and  found to have degenerative arthritis of her right knee and the patient  elects to proceed with a right total knee arthroplasty.   PAST MEDICAL HISTORY:  1. Hypertension.  2. Hiatal hernia.  3. Gastroesophageal reflux disease.  4. Previous UTIs.  5. Insulin-dependent diabetes.  6. History of breast cancer.   PREVIOUS SURGICAL HISTORY:  The patient had neck surgery for ruptured disk  by Dr. Franky Macho April 2005.  The patient had mastectomy on the right for  breast cancer March 2001, cholecystectomy approximately 1 year ago.  The  patient had tonsillectomy and appendectomy many years ago.   FAMILY HISTORY:  Significant for mother had heart disease, diabetes.  Sister  bone cancer.  Mother also had arthritis.   PRIMARY CARE PHYSICIAN:  Dr. Theophilus Bones in [facility].  We will consult  [facility], Dr. Jamie Brookes, to assist [place] with her medical issues  while she is in the hospital recovering from her knee arthroscopy.   CURRENT MEDICATIONS:  1. Aspirin daily.  2. Atenolol 25 mg daily.  3. Glucophage 1000 mg b.i.d.  4. 70/30 insulin 45 units b.i.d.  5. Regular insulin 10 units p.r.n.  6. Isosorbide 20 mg b.i.d.  7. Lexapro 10 mg daily.  8. Lipitor 20 mg  daily.  9. Maxzide 25 mg daily.  10.      Neurontin 600 mg t.i.d.  11.      Nexium 40 mg daily.  12.      Nolvadex 10 mg daily.  13.      Oxycodone 5 mg q.4 h. p.r.n.   REVIEW OF SYSTEMS:  GENERAL:  Denies weight changes, fever, chills, fatigue.  HEENT:  Denies headache, visual changes, tinnitus, hearing loss, or sore  throat.  CARDIOVASCULAR:  Denies chest pain, palpitations, shortness of  breath,  orthopnea.  PULMONARY:  Denies dyspnea, wheezing, cough, sputum  production, hemoptysis.  GU:  Denies dysuria, frequency, urgency, hematuria.  ENDOCRINE:  Denies polyuria, polydipsia, appetite change, heat or cold  intolerance.  MUSCULOSKELETAL:  The patient has right knee pain.  NEUROLOGICAL:  Denies dizziness, vertigo, syncope, seizure.  SKIN:  Denies  itching, rashes, masses, or moles.   PHYSICAL EXAMINATION:  VITAL SIGNS:  Temperature 98.8, pulse 70,  respirations 18, blood pressure 108/60 left arm sitting.  GENERAL:  A 77 year old female in no acute distress.  HEENT:  PERRL, EOMs intact, pharynx clear, TMs intact.  NECK:  Supple without masses.  CHEST:  Clear to auscultation bilaterally.  No wheezing, rales, or rhonchi  noted.  HEART:  Regular rate and rhythm without murmur.  ABDOMEN:  Positive bowel sounds.  Soft, nontender.  No organomegaly or  abnormal masses.  EXTREMITIES:  Examination of her right knee reveals positive effusion.  She  has limited flexion along with crepitus with range of motion.  SKIN:  Warm and dry.   X-ray of her right knee reveals severe degenerative arthritis right knee.   IMPRESSION:  Severe degenerative arthritis right knee.   PLAN:  The patient is to be admitted to [facility] to undergo a  right total knee arthroplasty Nov 22, 2003.     [name]. Paitsel, P.A.                     Ronald A. Darrelyn Hillock, M.D.    Tilden Dome  D:  11/17/2003  T:  11/17/2003  Job:  [id number]

## 2014-02-08 ENCOUNTER — Encounter (HOSPITAL_COMMUNITY): Admission: AD | Disposition: A | Discharge status: Home Health | Payer: Self-pay | Attending: Cardiovascular Disease

## 2014-02-08 ENCOUNTER — Inpatient Hospital Stay (HOSPITAL_COMMUNITY)
Admission: AD | Admit: 2014-02-08 | Discharge: 2014-02-11 | DRG: 280 | Disposition: A | Discharge status: Home Health | Payer: Medicare Other | Source: Other Acute Inpatient Hospital | Attending: Cardiovascular Disease | Admitting: Cardiovascular Disease

## 2014-02-08 ENCOUNTER — Encounter (HOSPITAL_COMMUNITY): Payer: Self-pay | Admitting: *Deleted

## 2014-02-08 DIAGNOSIS — Z85118 Personal history of other malignant neoplasm of bronchus and lung: Secondary | ICD-10-CM

## 2014-02-08 DIAGNOSIS — F329 Major depressive disorder, single episode, unspecified: Secondary | ICD-10-CM | POA: Diagnosis present

## 2014-02-08 DIAGNOSIS — Z9010 Acquired absence of unspecified breast and nipple: Secondary | ICD-10-CM

## 2014-02-08 DIAGNOSIS — Z79899 Other long term (current) drug therapy: Secondary | ICD-10-CM

## 2014-02-08 DIAGNOSIS — I129 Hypertensive chronic kidney disease with stage 1 through stage 4 chronic kidney disease, or unspecified chronic kidney disease: Secondary | ICD-10-CM | POA: Diagnosis present

## 2014-02-08 DIAGNOSIS — F3289 Other specified depressive episodes: Secondary | ICD-10-CM | POA: Diagnosis present

## 2014-02-08 DIAGNOSIS — E119 Type 2 diabetes mellitus without complications: Secondary | ICD-10-CM

## 2014-02-08 DIAGNOSIS — T383X1A Poisoning by insulin and oral hypoglycemic [antidiabetic] drugs, accidental (unintentional), initial encounter: Secondary | ICD-10-CM | POA: Diagnosis present

## 2014-02-08 DIAGNOSIS — I509 Heart failure, unspecified: Secondary | ICD-10-CM | POA: Diagnosis present

## 2014-02-08 DIAGNOSIS — Z6837 Body mass index (BMI) 37.0-37.9, adult: Secondary | ICD-10-CM | POA: Diagnosis not present

## 2014-02-08 DIAGNOSIS — Z853 Personal history of malignant neoplasm of breast: Secondary | ICD-10-CM

## 2014-02-08 DIAGNOSIS — I2584 Coronary atherosclerosis due to calcified coronary lesion: Secondary | ICD-10-CM | POA: Diagnosis present

## 2014-02-08 DIAGNOSIS — I5031 Acute diastolic (congestive) heart failure: Secondary | ICD-10-CM | POA: Diagnosis present

## 2014-02-08 DIAGNOSIS — R7989 Other specified abnormal findings of blood chemistry: Secondary | ICD-10-CM

## 2014-02-08 DIAGNOSIS — Z794 Long term (current) use of insulin: Secondary | ICD-10-CM

## 2014-02-08 DIAGNOSIS — IMO0001 Reserved for inherently not codable concepts without codable children: Secondary | ICD-10-CM | POA: Diagnosis present

## 2014-02-08 DIAGNOSIS — I2510 Atherosclerotic heart disease of native coronary artery without angina pectoris: Secondary | ICD-10-CM | POA: Diagnosis present

## 2014-02-08 DIAGNOSIS — F32A Depression, unspecified: Secondary | ICD-10-CM | POA: Diagnosis present

## 2014-02-08 DIAGNOSIS — R0902 Hypoxemia: Secondary | ICD-10-CM | POA: Diagnosis not present

## 2014-02-08 DIAGNOSIS — N183 Chronic kidney disease, stage 3 unspecified: Secondary | ICD-10-CM | POA: Diagnosis present

## 2014-02-08 DIAGNOSIS — E1169 Type 2 diabetes mellitus with other specified complication: Secondary | ICD-10-CM | POA: Diagnosis present

## 2014-02-08 DIAGNOSIS — I214 Non-ST elevation (NSTEMI) myocardial infarction: Principal | ICD-10-CM | POA: Diagnosis present

## 2014-02-08 DIAGNOSIS — T38801A Poisoning by unspecified hormones and synthetic substitutes, accidental (unintentional), initial encounter: Secondary | ICD-10-CM | POA: Diagnosis present

## 2014-02-08 DIAGNOSIS — M797 Fibromyalgia: Secondary | ICD-10-CM | POA: Diagnosis present

## 2014-02-08 DIAGNOSIS — R110 Nausea: Secondary | ICD-10-CM | POA: Diagnosis present

## 2014-02-08 HISTORY — PX: CARDIAC CATHETERIZATION: SHX172

## 2014-02-08 HISTORY — DX: Fibromyalgia: M79.7

## 2014-02-08 HISTORY — PX: CORONARY ANGIOGRAM: SHX5466

## 2014-02-08 HISTORY — DX: Depression, unspecified: F32.A

## 2014-02-08 HISTORY — DX: Unspecified osteoarthritis, unspecified site: M19.90

## 2014-02-08 HISTORY — DX: Major depressive disorder, single episode, unspecified: F32.9

## 2014-02-08 HISTORY — DX: Type 2 diabetes mellitus without complications: E11.9

## 2014-02-08 LAB — CBC
HCT: 47.5 % — ABNORMAL HIGH (ref 36.0–46.0)
Hemoglobin: 14.3 g/dL (ref 12.0–15.0)
MCH: 27.2 pg (ref 26.0–34.0)
MCHC: 30.1 g/dL (ref 30.0–36.0)
MCV: 90.5 fL (ref 78.0–100.0)
Platelets: 160 10*3/uL (ref 150–400)
RBC: 5.25 MIL/uL — AB (ref 3.87–5.11)
RDW: 17.2 % — AB (ref 11.5–15.5)
WBC: 11 10*3/uL — ABNORMAL HIGH (ref 4.0–10.5)

## 2014-02-08 LAB — LIPID PANEL
CHOL/HDL RATIO: 9.1 ratio
Cholesterol: 128 mg/dL (ref 0–200)
HDL: 14 mg/dL — ABNORMAL LOW (ref >39)
LDL Cholesterol: 87 mg/dL (ref 0–99)
Triglycerides: 135 mg/dL (ref <150)
VLDL: 27 mg/dL (ref 0–40)

## 2014-02-08 LAB — URINE MICROSCOPIC-ADD ON

## 2014-02-08 LAB — TROPONIN I: Troponin I: <0.3 ng/mL (ref <0.30)

## 2014-02-08 LAB — GLUCOSE, CAPILLARY
GLUCOSE-CAPILLARY: 69 mg/dL — AB (ref 70–99)
GLUCOSE-CAPILLARY: 113 mg/dL — AB (ref 70–99)
GLUCOSE-CAPILLARY: 94 mg/dL (ref 70–99)
GLUCOSE-CAPILLARY: 79 mg/dL (ref 70–99)
Glucose-Capillary: 71 mg/dL (ref 70–99)
Glucose-Capillary: 64 mg/dL — ABNORMAL LOW (ref 70–99)
Glucose-Capillary: 83 mg/dL (ref 70–99)

## 2014-02-08 LAB — HEMOGLOBIN A1C
Hgb A1c MFr Bld: 8.9 % — ABNORMAL HIGH (ref <5.7)
Mean Plasma Glucose: 209 mg/dL — ABNORMAL HIGH (ref <117)

## 2014-02-08 LAB — COMPREHENSIVE METABOLIC PANEL
ALT: 121 U/L — ABNORMAL HIGH (ref 0–35)
ANION GAP: 15 (ref 5–15)
AST: 161 U/L — ABNORMAL HIGH (ref 0–37)
Albumin: 3 g/dL — ABNORMAL LOW (ref 3.5–5.2)
Alkaline Phosphatase: 69 U/L (ref 39–117)
BUN: 22 mg/dL (ref 6–23)
CO2: 29 mEq/L (ref 19–32)
Calcium: 8.5 mg/dL (ref 8.4–10.5)
Chloride: 102 mEq/L (ref 96–112)
Creatinine, Ser: 1.24 mg/dL — ABNORMAL HIGH (ref 0.50–1.10)
GFR calc non Af Amer: 40 mL/min — ABNORMAL LOW (ref >90)
GFR, EST AFRICAN AMERICAN: 46 mL/min — AB (ref >90)
Glucose, Bld: 95 mg/dL (ref 70–99)
Potassium: 3.4 mEq/L — ABNORMAL LOW (ref 3.7–5.3)
Sodium: 146 mEq/L (ref 137–147)
TOTAL PROTEIN: 6.1 g/dL (ref 6.0–8.3)
Total Bilirubin: 0.9 mg/dL (ref 0.3–1.2)

## 2014-02-08 LAB — URINALYSIS, ROUTINE W REFLEX MICROSCOPIC
Glucose, UA: 250 mg/dL — AB
KETONES UR: 15 mg/dL — AB
Nitrite: NEGATIVE
PROTEIN: 30 mg/dL — AB
Specific Gravity, Urine: 1.028 (ref 1.005–1.030)
Urobilinogen, UA: 1 mg/dL (ref 0.0–1.0)
pH: 5.5 (ref 5.0–8.0)

## 2014-02-08 SURGERY — CORONARY ANGIOGRAM

## 2014-02-08 MED ORDER — HEPARIN (PORCINE) IN NACL 100-0.45 UNIT/ML-% IJ SOLN
1400.0000 [IU]/h | INTRAMUSCULAR | Status: DC
  Administered 2014-02-08: 1400 [IU]/h via INTRAVENOUS
  Filled 2014-02-08: qty 250

## 2014-02-08 MED ORDER — INSULIN ASPART 100 UNIT/ML ~~LOC~~ SOLN
0.0000 [IU] | Freq: Three times a day (TID) | SUBCUTANEOUS | Status: DC
  Administered 2014-02-09 – 2014-02-10 (×4): 3 [IU] via SUBCUTANEOUS
  Administered 2014-02-11: 5 [IU] via SUBCUTANEOUS
  Administered 2014-02-11: 3 [IU] via SUBCUTANEOUS

## 2014-02-08 MED ORDER — DEXTROSE 50 % IV SOLN
INTRAVENOUS | Status: AC
  Filled 2014-02-08: qty 50

## 2014-02-08 MED ORDER — PANTOPRAZOLE SODIUM 40 MG PO TBEC
40.0000 mg | DELAYED_RELEASE_TABLET | Freq: Every day | ORAL | Status: DC
  Administered 2014-02-08 – 2014-02-11 (×4): 40 mg via ORAL
  Filled 2014-02-08 (×4): qty 1

## 2014-02-08 MED ORDER — ASPIRIN 81 MG PO CHEW
81.0000 mg | CHEWABLE_TABLET | ORAL | Status: DC
  Filled 2014-02-08: qty 1

## 2014-02-08 MED ORDER — NITROGLYCERIN 0.4 MG SL SUBL: 0.4000 mg | SUBLINGUAL_TABLET | SUBLINGUAL | Status: DC | PRN

## 2014-02-08 MED ORDER — FENTANYL CITRATE 0.05 MG/ML IJ SOLN
INTRAMUSCULAR | Status: AC
  Filled 2014-02-08: qty 2

## 2014-02-08 MED ORDER — SODIUM CHLORIDE 0.9 % IV SOLN
INTRAVENOUS | Status: DC
  Administered 2014-02-08: 20:00:00 via INTRAVENOUS

## 2014-02-08 MED ORDER — SODIUM CHLORIDE 0.9 % IJ SOLN: 3.0000 mL | Freq: Two times a day (BID) | INTRAMUSCULAR | Status: DC

## 2014-02-08 MED ORDER — SODIUM CHLORIDE 0.9 % IV SOLN
1.0000 mL/kg/h | INTRAVENOUS | Status: DC
  Administered 2014-02-08: 0.849 mL/kg/h via INTRAVENOUS

## 2014-02-08 MED ORDER — SODIUM CHLORIDE 0.9 % IV SOLN: 250.0000 mL | INTRAVENOUS | Status: DC | PRN

## 2014-02-08 MED ORDER — HEPARIN (PORCINE) IN NACL 2-0.9 UNIT/ML-% IJ SOLN
INTRAMUSCULAR | Status: AC
  Filled 2014-02-08: qty 1500

## 2014-02-08 MED ORDER — VENLAFAXINE HCL ER 37.5 MG PO CP24
37.5000 mg | ORAL_CAPSULE | Freq: Every day | ORAL | Status: DC
  Administered 2014-02-08 – 2014-02-10 (×3): 37.5 mg via ORAL
  Filled 2014-02-08 (×5): qty 1

## 2014-02-08 MED ORDER — METOPROLOL TARTRATE 25 MG PO TABS
25.0000 mg | ORAL_TABLET | Freq: Two times a day (BID) | ORAL | Status: DC
  Administered 2014-02-08 – 2014-02-11 (×7): 25 mg via ORAL
  Filled 2014-02-08 (×8): qty 1

## 2014-02-08 MED ORDER — ASPIRIN EC 81 MG PO TBEC
81.0000 mg | DELAYED_RELEASE_TABLET | Freq: Every day | ORAL | Status: DC
  Administered 2014-02-09 – 2014-02-11 (×3): 81 mg via ORAL
  Filled 2014-02-08 (×3): qty 1

## 2014-02-08 MED ORDER — NYSTATIN 100000 UNIT/GM EX CREA
TOPICAL_CREAM | Freq: Two times a day (BID) | CUTANEOUS | Status: DC
  Administered 2014-02-08 – 2014-02-09 (×3): via TOPICAL
  Administered 2014-02-10: 1 via TOPICAL
  Administered 2014-02-10 – 2014-02-11 (×2): via TOPICAL
  Filled 2014-02-08: qty 15

## 2014-02-08 MED ORDER — ATORVASTATIN CALCIUM 40 MG PO TABS
40.0000 mg | ORAL_TABLET | Freq: Every day | ORAL | Status: DC
  Administered 2014-02-08 – 2014-02-10 (×3): 40 mg via ORAL
  Filled 2014-02-08 (×4): qty 1

## 2014-02-08 MED ORDER — ONDANSETRON HCL 4 MG/2ML IJ SOLN: 4.0000 mg | Freq: Four times a day (QID) | INTRAMUSCULAR | Status: DC | PRN

## 2014-02-08 MED ORDER — MORPHINE SULFATE ER 15 MG PO TBCR
30.0000 mg | EXTENDED_RELEASE_TABLET | Freq: Two times a day (BID) | ORAL | Status: DC
  Administered 2014-02-08 – 2014-02-11 (×7): 30 mg via ORAL
  Filled 2014-02-08 (×7): qty 2

## 2014-02-08 MED ORDER — LIDOCAINE HCL (PF) 1 % IJ SOLN
INTRAMUSCULAR | Status: AC
  Filled 2014-02-08: qty 30

## 2014-02-08 MED ORDER — ACETAMINOPHEN 325 MG PO TABS: 650.0000 mg | ORAL_TABLET | ORAL | Status: DC | PRN

## 2014-02-08 MED ORDER — MIDAZOLAM HCL 2 MG/2ML IJ SOLN
INTRAMUSCULAR | Status: AC
Start: 2014-02-08 — End: 2014-02-08
  Filled 2014-02-08: qty 2

## 2014-02-08 MED ORDER — FLUMAZENIL 1 MG/10ML IV SOLN
INTRAVENOUS | Status: AC
  Filled 2014-02-08: qty 10

## 2014-02-08 MED ORDER — HEPARIN SODIUM (PORCINE) 1000 UNIT/ML IJ SOLN
INTRAMUSCULAR | Status: AC
  Filled 2014-02-08: qty 1

## 2014-02-08 MED ORDER — HEPARIN BOLUS VIA INFUSION
4000.0000 [IU] | Freq: Once | INTRAVENOUS | Status: AC
  Administered 2014-02-08: 4000 [IU] via INTRAVENOUS
  Filled 2014-02-08: qty 4000

## 2014-02-08 MED ORDER — CETYLPYRIDINIUM CHLORIDE 0.05 % MT LIQD
7.0000 mL | Freq: Two times a day (BID) | OROMUCOSAL | Status: DC
  Administered 2014-02-08 – 2014-02-11 (×6): 7 mL via OROMUCOSAL

## 2014-02-08 MED ORDER — DEXTROSE 50 % IV SOLN
25.0000 mL | Freq: Once | INTRAVENOUS | Status: AC | PRN
  Administered 2014-02-08: 25 mL via INTRAVENOUS

## 2014-02-08 MED ORDER — GABAPENTIN 400 MG PO CAPS
800.0000 mg | ORAL_CAPSULE | Freq: Two times a day (BID) | ORAL | Status: DC
Start: 2014-02-08 — End: 2014-02-11
  Administered 2014-02-08 – 2014-02-11 (×7): 800 mg via ORAL
  Filled 2014-02-08 (×8): qty 2

## 2014-02-08 MED ORDER — NITROGLYCERIN 1 MG/10 ML FOR IR/CATH LAB
INTRA_ARTERIAL | Status: AC
  Filled 2014-02-08: qty 10

## 2014-02-08 MED ORDER — SODIUM CHLORIDE 0.9 % IJ SOLN: 3.0000 mL | INTRAMUSCULAR | Status: DC | PRN

## 2014-02-08 NOTE — Progress Notes (Addendum)
See Dr. Starla Link H&P for full detail  80 year old Caucasian female with chronic diabetes, hypertension and history of breast and lung cancer status post mastectomy and a left lower lobe resection presented to [facility] ED with hypoglycemia after increased insulin injection. She denies any chest pain, however has been nausea without vomiting for the past 3 days. Her troponin was obtained at outside hospital was elevated at 0.277, and the patient was transferred to [facility] for further evaluation  Also her laboratory finding include: Sodium 147, potassium 3.5, chloride 104, carbon dioxide 35.2, BUN 25, creatinine 1.9, glucose 49 Urinalysis showed 2+ protein, 1+ ketone, negative nitrite, appearance cloudy, 2+ bacteria  TSH 1.335 PT 15.3, INR 1.45 CBC white blood cell 14.4, hemoglobin 15.7, hematocrit 48.7, platelet 236 Pro BNP 16,701 Abdominal x-ray, and nonspecific, nonobstructive gas pattern  Physical exam: Heart: RRR, no murmur Lung: rale in R basilar area. General: frail appearing, daughter at bedside  EKG: prolonged QTc, TWI in anterior lead, poor R wave progression  1. Nausea - ?if anginal equivalent  - currently labs pending 2. Elevated proBNP  - no prior cardiac history, obtain echo 3. Possible UTI  - negative nitrite, 2+ bacteria  - will prophylactically treat with abx 4. Hypoglycemia 5. DM 6. HTN 7. Obesity 8. Breast CA s/p mastectomy 9. Lung CA s/p LLL resection  Plan:  Labs currently pending. Her proBNP elevated at 16000, obtained Echo. Patient's troponin was elevated at 0.277, however in the setting of Cr 1.9 and proBNP of 16,000. Does have risk factors include HTN and DM, EKG showed TWI in anterior lead and poor R wave progression. Repeat troponin, if it continued to trend up, will consider cardiac catheterization. For now, stress test may be a better option. Echocardiogram to rule out heart failure. We'll also prophylactically treat UTI.   Attending Note:   The  patient was seen and examined.  Agree with assessment and plan as noted above.  Changes made to the above note as needed.  A/P 1. + troponin:  She has atypical symptoms - nausea  But does have marked DOE with very little exertion.  Her ECG is very worrisome for an LAD stenosis.   The 2nd Troponin is still pending I 've discussed risks, benefits, options of cardiac cath.  She understands and agrees to proceed.  2. CKD:  Stage 2-3.  creatinin has improved with IV hydration  3. DM:  Labile glucose levels.  Holding insulin this am.    Ramond Dial., MD, [facility] 02/08/2014, 8:37 AM [address]  [place] Pager [phone]

## 2014-02-08 NOTE — Progress Notes (Signed)
Patient blood sugar low 69 asymptomatic .Patient is NPO  1/2 amp of d50 given per hypoglycemic  Protocol.Blood sugar rechecked 113. Will continue to monitor.

## 2014-02-08 NOTE — Progress Notes (Signed)
Utilization review completed.  

## 2014-02-08 NOTE — Progress Notes (Signed)
Nutrition Brief Note  Patient identified on the Malnutrition Screening Tool (MST) Report for unintentional weight loss. Weight is usually ~275 lb per discussion with patient. She is obese with BMI = 39.5. Nutrition focused physical exam completed.  No muscle or subcutaneous fat depletion noticed.  Wt Readings from Last 15 Encounters:  02/08/14 259 lb 9.6 oz (117.754 kg)  02/08/14 259 lb 9.6 oz (117.754 kg)    Body mass index is 39.48 kg/(m^2). Patient meets criteria for class 2 obesity based on current BMI.   Current diet order is NPO, patient is awaiting procedures/tests. She is hungry, ready to eat or drink something now. Suspect intake will be good once diet is advanced. Labs and medications reviewed.   No nutrition interventions warranted at this time. If nutrition issues arise, please consult RD.   Molli Barrows, RD, LDN, [place] Pager [phone] After Hours Pager [phone]

## 2014-02-08 NOTE — CV Procedure (Addendum)
   Cardiac Catheterization Procedure Note  Name: [name] MRN: [id number] DOB: 04-30-1931  Procedure: Selective Coronary Angiography, incomplete study due to inability to cannulate the left main coronary artery via the right radial artery.  Indication: Non-ST elevation myocardial infarction  Medications:  Sedation:  1 mg IV Versed, 25 mcg IV Fentanyl. Versed was reversed with flumazenil 0.2 mg times one due to respiratory depression.   Contrast:  130 ml Omnipaque   Procedural Details: The right wrist was prepped, draped, and anesthetized with 1% lidocaine. arterial access was difficult due to weakness. I used ultrasound guidance.  Using the modified Seldinger technique, a 5 French sheath was introduced into the right radial artery. 3 mg of verapamil was administered through the sheath, weight-based unfractionated heparin was administered intravenously. A Jackie catheter was used for selective coronary angiography. I could not selectively engage the coronary arteries with this. A JR 4 catheter was used to engage the right coronary artery. The left main coronary artery could not be engaged in spite of multiple catheters including the Jackie, TIG, JL 3.5, a.l. 1 and 2 different guides including JL 3 and Ikari left 3.5. The procedure was thus aborted.  There were no immediate procedural complications. A TR band was used for radial hemostasis at the completion of the procedure.  The patient was transferred to the post catheterization recovery area for further monitoring.  Procedural Findings:  Hemodynamics: AO:  126/64   MmHg  Coronary angiography: Coronary dominance: Right    Left Main:  Appears to be free of significant disease.   Left Anterior Descending (LAD):  Heavily calcified but not well visualized with nonselective injection.   Right Coronary Artery: Large in size and dominant. The vessel is moderately calcified. There is 20% proximal stenosis. There is 60-70% mid stenosis and  60-70% distal stenosis. There is significant disease involving the second RV marginal.   Posterior descending artery: Subtotally occluded with diffuse disease in the proximal and midsegment. The vessel is medium in size.   Posterior AV segment: Normal in size with minor irregularities.    Left ventriculography: Not performed   Final Conclusions:   1. Inability to cannulate the left main coronary artery via the right radial artery in spite of multiple attempts and using different catheters.   2. There is borderline significant disease in the mid and distal RCA with subtotal occlusion of a medium size right PDA.  Recommendations:  Recommend cardiac catheterization via the right femoral artery tomorrow. The left radial pulse was weak. Obtaining access via the right radial artery was difficult. Ultrasound was used.   I elected not to obtain access via the right femoral artery today given that she was fully anticoagulated . I am also going to hydrate her overnight. The patient developed respiratory depression with severe hypoxia after she was given 1 mg of Versed and 25 mcg of Fentanyl. She responded to Flumazenil and Oxygen. Avoid over sedation for future procedures.   Kathlyn Sacramento MD, [facility] 02/08/2014, 4:26 PM

## 2014-02-08 NOTE — Progress Notes (Signed)
ANTICOAGULATION CONSULT NOTE - Initial Consult  Pharmacy Consult for Heparin Indication: chest pain/ACS  No Known Allergies  Patient Measurements: Height: 5\' 8"  (172.7 cm) Weight: 259 lb 9.6 oz (117.754 kg) IBW/kg (Calculated) : 63.9 Heparin Dosing Weight: 90 kg  Vital Signs: Temp: 98.5 F (36.9 C) (08/12 0541) Temp src: Oral (08/12 0541) BP: 119/66 mmHg (08/12 0541) Pulse Rate: 77 (08/12 0541)  Labs (at [facility]): WBC  14.4 Hgb  15.7 Hct  48.7 Plt  236  Troponin 0.28    SCr  1.9  INR 1.45 No results found for this basename: HGB, HCT, PLT, APTT, LABPROT, INR, HEPARINUNFRC, CREATININE, CKTOTAL, CKMB, TROPONINI,  in the last 72 hours  Estimated Creatinine Clearance: 73.9 ml/min (by C-G formula based on Cr of 0.82).   Medical History: Past Medical History  Diagnosis Date  . Fibromyalgia   . Arthritis   . Diabetes mellitus without complication   . Depression     Medications:  Humalog 75/25  Norco  MSContin  Atenolol  Pristiq  Neurontin  Zantac    Assessment: 80 yo female with hypoglycemia, elevated cardiac markers, for heparin  Goal of Therapy:  Heparin level 0.3-0.7 units/ml Monitor platelets by anticoagulation protocol: Yes   Plan:  Heparin 4000 units IV bolus, then 1400 units/hr Check heparin level in 8 hours.   Caryl Pina 02/08/2014,6:09 AM

## 2014-02-08 NOTE — Interval H&P Note (Signed)
Cath Lab Visit (complete for each Cath Lab visit)  Clinical Evaluation Leading to the Procedure:   ACS: Yes.    Non-ACS:    Anginal Classification: CCS IV  Anti-ischemic medical therapy: Minimal Therapy (1 class of medications)  Non-Invasive Test Results: No non-invasive testing performed  Prior CABG: No previous CABG      History and Physical Interval Note:  02/08/2014 3:15 PM  [name]  has presented today for surgery, with the diagnosis of positive enzyms  The various methods of treatment have been discussed with the patient and family. After consideration of risks, benefits and other options for treatment, the patient has consented to  Procedure(s): LEFT HEART CATHETERIZATION WITH CORONARY ANGIOGRAM (N/A) as a surgical intervention .  The patient's history has been reviewed, patient examined, no change in status, stable for surgery.  I have reviewed the patient's chart and labs.  Questions were answered to the patient's satisfaction.     Kathlyn Sacramento

## 2014-02-08 NOTE — H&P (Signed)
History and Physical  Patient ID: YOLANDA HUFFSTETLER MRN: [id number], SOB: Jul 10, 1930 80 y.o. Date of Encounter: 02/08/2014, 4:53 AM  Primary Physician: No primary provider on file. Primary Cardiologist: unassigned  Chief Complaint: low blood sugar, nausea  HPI:  80 yo female with PMH sig for DM2, HTN, obesity, h/o lung CA s/p resection, h/o breast CA s/p mastectomy presenting initially to [facility] after being found to be hypoglycemic after increasing her insulin due to elevated CBG. Normally takes 30 units 70/30 but due to an elevated gluocose of 400, she took at additional 30 units. presented to [facility] and was reportedly slightly altered and found to have a CBG of 47. Given D50 and CBG improved to 80s and mentation improved.   Workup there included a screening troponin which was positive at 0.3. Due to this she was transferred to [facility]. Prior to transport, she was given aspirin 324 mg.  She states that she has not had any chest pain. However, for the past 2-3 days, she has felt nauseated but no vomiting. Poor apetite and dark urine as well. Mildly short of breath with the nausea. No PND or orthopnea. Limited mobility due to chronic pain.  ROS endorses constipation, last BM 2-3 days ago  No known previous heart conditions though previously has been long acting nitrates.   EKG demonstrates sinus rhythm, diffuse TWI, no comparison  Acute abdomen series at [facility]: nonspec nonobs bowel gas pattern with dilation, possible mild ileus chronic elevation of L hemidiaphragm  Labs: TSh 1.3 troponin  INR 1.4 WBC 14.4 HCt 48 plts 236  Pro BNP 16701 UA brown color, neg nitrite, +ketones, +protein,  Cr 1.9 (reportedly normal previously) NA 147 K 3.5 BUN 29    PMH: 1. Diabetes, on insulin 2. HTN 3. Breast CA s/p mastectomy 4. Lung CA s/p LLL resection 5. s/p cholecystectomy 6. s/p knee surgery 7. Chronic pain/fibromyalgia 8. obesity   Home Meds: atenolol 25  qday gabapentin 800 tid MScontin 30 bid pristiq 50 qday insulin 70/30 35 units bid ranitidine 150 qday  Allergies: NKDA  SH: Married Daughter is ER nurse Remote tobacco abuse nondrinker  No family history on file. Not applicable  Review of Systems: General: negative for chills, fever, night sweats or weight changes.  Cardiovascular: see HPI Dermatological: negative for rash Respiratory: negative for cough or wheezing Urologic: negative for hematuria, +dark urine Abdominal: +constipation, +poor appetiite,  Neurologic: negative for visual changes, syncope, or dizziness All other systems reviewed and are otherwise negative except as noted above.  Labs:  see HPI    EKG: sinus, diffuse TWI  Physical Exam There were no vitals taken for this visit. Pending General: in no acute distress. Head: Normocephalic, atraumatic, sclera non-icteric, nares are without discharge Neck: Supple. Unable to assess JVD due to neck habitus Lungs: Clear bilaterally to auscultation without wheezes, rales, or rhonchi. Breathing is unlabored. Heart: RRR with S1 S2. 1/6 SEM Abdomen: Soft, large pannus. Mildly tender in LUQ. No rebound/guarding. No obvious abdominal masses. Msk:  Strength and tone appear normal for age. Extremities: No edema. No clubbing or cyanosis. Distal pedal pulses are 2+ and equal bilaterally. Neuro: Alert and oriented X 3. Moves all extremities spontaneously. Psych:  Responds to questions appropriately with a normal affect.    ASSESSMENT AND PLAN:  Problem List 1. Nausea --> possible anginal equivalent, +troponin 2. Acute renal insufficiency 3. Possible ileus 4. Hypoglycemia from excess insulin 5. Obesity 6. Insulin dependent diabetes 7. HTN 8. Presumed congestive  heart failure with elevated BNP, EF unknown.\ 9. Chronic pain/fibromylagia  80 yo female with PMH sig for DM2, HTN, obesity, h/o lung CA s/p resection, h/o breast CA s/p mastectomy presenting initially to  [facility] after being found to be hypoglycemic after increasing her insulin due to elevated CBG, also with 3 days of nausea, found to have elevated troponin.  Her presentation is concerning for a NSTEMI given 3 days of nausea (possible anginal equivalent in a diabetic) and now evidence of myocardial damage with positive biomarkers. Trend troponins. Continue aspirin, Will add heparin gtt for ACS, switch beta blocker to metoprolol, add statin, and keep NPO for potential further risk stratification (invasive vs. noninvasive). Renal function is noted. Will get echo as well.  Regarding the possible ileus, will keep NPO for now. If sxs worsen, consider CT imaging to look for etiology.  Due to the persistent low blood sugars, maintain D5 fluids though will need to be cautious given elevated BNP. Frequent cbgs until normalizes. Hold insulin for now but will need regimen added back soon.  Renal insuffiency sounds pre-renal based upon history. Will recheck now that fluids given.  Continue home pain medications.  Prophylaxis: PPI heparin gtt  Full code but does not want prolonged support  Signed, Sandia Pfund C. MD 02/08/2014, 4:53 AM

## 2014-02-09 ENCOUNTER — Encounter (HOSPITAL_COMMUNITY): Admission: AD | Disposition: A | Discharge status: Home Health | Payer: Self-pay | Attending: Cardiovascular Disease

## 2014-02-09 DIAGNOSIS — M797 Fibromyalgia: Secondary | ICD-10-CM | POA: Diagnosis present

## 2014-02-09 DIAGNOSIS — F329 Major depressive disorder, single episode, unspecified: Secondary | ICD-10-CM | POA: Diagnosis present

## 2014-02-09 DIAGNOSIS — E119 Type 2 diabetes mellitus without complications: Secondary | ICD-10-CM | POA: Diagnosis present

## 2014-02-09 DIAGNOSIS — I2510 Atherosclerotic heart disease of native coronary artery without angina pectoris: Secondary | ICD-10-CM

## 2014-02-09 DIAGNOSIS — F32A Depression, unspecified: Secondary | ICD-10-CM | POA: Diagnosis present

## 2014-02-09 HISTORY — PX: LEFT HEART CATHETERIZATION WITH CORONARY ANGIOGRAM: SHX5451

## 2014-02-09 LAB — GLUCOSE, CAPILLARY
GLUCOSE-CAPILLARY: 120 mg/dL — AB (ref 70–99)
GLUCOSE-CAPILLARY: 158 mg/dL — AB (ref 70–99)
Glucose-Capillary: 113 mg/dL — ABNORMAL HIGH (ref 70–99)
Glucose-Capillary: 127 mg/dL — ABNORMAL HIGH (ref 70–99)
Glucose-Capillary: 146 mg/dL — ABNORMAL HIGH (ref 70–99)
Glucose-Capillary: 185 mg/dL — ABNORMAL HIGH (ref 70–99)

## 2014-02-09 LAB — CBC
HCT: 46.1 % — ABNORMAL HIGH (ref 36.0–46.0)
Hemoglobin: 14 g/dL (ref 12.0–15.0)
MCH: 27.5 pg (ref 26.0–34.0)
MCHC: 30.4 g/dL (ref 30.0–36.0)
MCV: 90.4 fL (ref 78.0–100.0)
Platelets: 153 10*3/uL (ref 150–400)
RBC: 5.1 MIL/uL (ref 3.87–5.11)
RDW: 17.3 % — AB (ref 11.5–15.5)
WBC: 9.8 10*3/uL (ref 4.0–10.5)

## 2014-02-09 LAB — BASIC METABOLIC PANEL
Anion gap: 14 (ref 5–15)
BUN: 16 mg/dL (ref 6–23)
CO2: 29 mEq/L (ref 19–32)
Calcium: 8.1 mg/dL — ABNORMAL LOW (ref 8.4–10.5)
Chloride: 101 mEq/L (ref 96–112)
Creatinine, Ser: 0.9 mg/dL (ref 0.50–1.10)
GFR calc Af Amer: 68 mL/min — ABNORMAL LOW (ref >90)
GFR calc non Af Amer: 59 mL/min — ABNORMAL LOW (ref >90)
Glucose, Bld: 153 mg/dL — ABNORMAL HIGH (ref 70–99)
Potassium: 3.9 mEq/L (ref 3.7–5.3)
Sodium: 144 mEq/L (ref 137–147)

## 2014-02-09 SURGERY — LEFT HEART CATHETERIZATION WITH CORONARY ANGIOGRAM
Anesthesia: LOCAL

## 2014-02-09 MED ORDER — SODIUM CHLORIDE 0.9 % IV SOLN: INTRAVENOUS | Status: AC

## 2014-02-09 MED ORDER — LIDOCAINE HCL (PF) 1 % IJ SOLN
INTRAMUSCULAR | Status: AC
  Filled 2014-02-09: qty 30

## 2014-02-09 MED ORDER — ASPIRIN 81 MG PO CHEW
81.0000 mg | CHEWABLE_TABLET | ORAL | Status: DC
  Filled 2014-02-09: qty 1

## 2014-02-09 MED ORDER — NITROGLYCERIN 1 MG/10 ML FOR IR/CATH LAB
INTRA_ARTERIAL | Status: AC
  Filled 2014-02-09: qty 10

## 2014-02-09 MED ORDER — FUROSEMIDE 10 MG/ML IJ SOLN
40.0000 mg | Freq: Two times a day (BID) | INTRAMUSCULAR | Status: DC
  Administered 2014-02-09 – 2014-02-10 (×2): 40 mg via INTRAVENOUS
  Filled 2014-02-09 (×4): qty 4

## 2014-02-09 MED ORDER — HEPARIN (PORCINE) IN NACL 2-0.9 UNIT/ML-% IJ SOLN
INTRAMUSCULAR | Status: AC
  Filled 2014-02-09: qty 1000

## 2014-02-09 MED ORDER — FUROSEMIDE 10 MG/ML IJ SOLN: 40.0000 mg | Freq: Two times a day (BID) | INTRAMUSCULAR | Status: DC

## 2014-02-09 NOTE — Progress Notes (Signed)
See Dr. Starla Link H&P for full detail  80 year old Caucasian female with chronic diabetes, hypertension and history of breast and lung cancer status post mastectomy and a left lower lobe resection presented to [facility] ED with hypoglycemia after increased insulin injection. She denies any chest pain, however has been nausea without vomiting for the past 3 days. Her troponin was obtained at outside hospital was elevated at 0.277, and the patient was transferred to [facility] for further evaluation  Also her laboratory finding include: Sodium 147, potassium 3.5, chloride 104, carbon dioxide 35.2, BUN 25, creatinine 1.9, glucose 49 Urinalysis showed 2+ protein, 1+ ketone, negative nitrite, appearance cloudy, 2+ bacteria  TSH 1.335 PT 15.3, INR 1.45 CBC white blood cell 14.4, hemoglobin 15.7, hematocrit 48.7, platelet 236 Pro BNP 16,701 Abdominal x-ray, and nonspecific, nonobstructive gas pattern  Physical exam: Heart: RRR, no murmur Lung: rale in R basilar area. General: frail appearing, daughter at bedside  EKG: prolonged QTc, TWI in anterior lead, poor R wave progression  1. Nausea - ?if anginal equivalent  - currently labs pending 2. Elevated proBNP  - no prior cardiac history, obtain echo 3. Possible UTI  - negative nitrite, 2+ bacteria  - will prophylactically treat with abx 4. Hypoglycemia 5. DM 6. HTN 7. Obesity 8. Breast CA s/p mastectomy 9. Lung CA s/p LLL resection  Plan:  Labs currently pending. Her proBNP elevated at 16000, obtained Echo. Patient's troponin was elevated at 0.277, however in the setting of Cr 1.9 and proBNP of 16,000. Does have risk factors include HTN and DM, EKG showed TWI in anterior lead and poor R wave progression. Repeat troponin, if it continued to trend up, will consider cardiac catheterization. For now, stress test may be a better option. Echocardiogram to rule out heart failure. We'll also prophylactically treat UTI.   Attending Note:   The  patient was seen and examined.  Agree with assessment and plan as noted above.  Changes made to the above note as needed.  A/P 1. + troponin:  She has atypical symptoms - nausea  But does have marked DOE with very little exertion.  Her ECG is very worrisome for an LAD stenosis.   Subsequent troponin levels are negative.  I 've discussed risks, benefits, options of cardiac cath.  She understands and agrees to proceed.  2. CKD:  Stage 2-3.  creatinine has improved with IV hydration  3. DM:  Labile glucose levels.  Holding insulin this am.    Ramond Dial., MD, [facility] 02/09/2014, 10:12 AM [address],  [place] Pager [phone]

## 2014-02-09 NOTE — H&P (View-Only) (Signed)
See Dr. Starla Link H&P for full detail  80 year old Caucasian female with chronic diabetes, hypertension and history of breast and lung cancer status post mastectomy and a left lower lobe resection presented to [facility] ED with hypoglycemia after increased insulin injection. She denies any chest pain, however has been nausea without vomiting for the past 3 days. Her troponin was obtained at outside hospital was elevated at 0.277, and the patient was transferred to [facility] for further evaluation  Also her laboratory finding include: Sodium 147, potassium 3.5, chloride 104, carbon dioxide 35.2, BUN 25, creatinine 1.9, glucose 49 Urinalysis showed 2+ protein, 1+ ketone, negative nitrite, appearance cloudy, 2+ bacteria  TSH 1.335 PT 15.3, INR 1.45 CBC white blood cell 14.4, hemoglobin 15.7, hematocrit 48.7, platelet 236 Pro BNP 16,701 Abdominal x-ray, and nonspecific, nonobstructive gas pattern  Physical exam: Heart: RRR, no murmur Lung: rale in R basilar area. General: frail appearing, daughter at bedside  EKG: prolonged QTc, TWI in anterior lead, poor R wave progression  1. Nausea - ?if anginal equivalent  - currently labs pending 2. Elevated proBNP  - no prior cardiac history, obtain echo 3. Possible UTI  - negative nitrite, 2+ bacteria  - will prophylactically treat with abx 4. Hypoglycemia 5. DM 6. HTN 7. Obesity 8. Breast CA s/p mastectomy 9. Lung CA s/p LLL resection  Plan:  Labs currently pending. Her proBNP elevated at 16000, obtained Echo. Patient's troponin was elevated at 0.277, however in the setting of Cr 1.9 and proBNP of 16,000. Does have risk factors include HTN and DM, EKG showed TWI in anterior lead and poor R wave progression. Repeat troponin, if it continued to trend up, will consider cardiac catheterization. For now, stress test may be a better option. Echocardiogram to rule out heart failure. We'll also prophylactically treat UTI.   Attending Note:   The  patient was seen and examined.  Agree with assessment and plan as noted above.  Changes made to the above note as needed.  A/P 1. + troponin:  She has atypical symptoms - nausea  But does have marked DOE with very little exertion.  Her ECG is very worrisome for an LAD stenosis.   Subsequent troponin levels are negative.  I 've discussed risks, benefits, options of cardiac cath.  She understands and agrees to proceed.  2. CKD:  Stage 2-3.  creatinine has improved with IV hydration  3. DM:  Labile glucose levels.  Holding insulin this am.    Ramond Dial., MD, [facility] 02/09/2014, 10:12 AM [address],  [place] Pager [phone]

## 2014-02-09 NOTE — CV Procedure (Signed)
Cardiac Catheterization Operative Report  [name] [id number] 8/13/20152:45 PM No primary provider on file.  Procedure Performed:  1. Left Heart Catheterization 2. Selective Coronary Angiography  Operator: Lauree Chandler, MD  Indication:  80 yo female with morbid obesity, DM, HTN, h/o lung CA s/p resection, h/o breast CA s/p mastectomy presenting initially to [facility] after being found to be hypoglycemic after increasing her insulin due to elevated CBG. Workup there included a screening troponin which was positive at 0.3. Due to this she was transferred to [facility]. She has had no chest pain. Her troponin here has been negative. Cardiac cath was attempted on 02/08/14 by Dr. Fletcher Anon from the right radial artery. He was unable to engage the left main with multiple catheters. He did visualize the RCA. She became unresponsive in the cath lab and sedation had to be reversed. She was anticoagulated so the procedure was aborted. I am asked today to perform a left heart cath from the right groin. As above, pt has not chest pain. She only c/o nausea.                                      Procedure Details: The risks, benefits, complications, treatment options, and expected outcomes were discussed with the patient. The patient and/or family concurred with the proposed plan, giving informed consent. The patient was brought to the cath lab after IV hydration was begun and oral premedication was given. The patient was further sedated with Versed and Fentanyl. The right groin was prepped and draped in the usual manner. Using the modified Seldinger access technique, a 5 French sheath was placed in the right femoral artery. Standard diagnostic catheters were used to perform selective coronary angiography. An AL-2 catheter and a long straight wire was used to cross the aortic valve. LV pressures obtained. Angioseal placed right femoral artery.   There were no immediate complications. The  patient was taken to the recovery area in stable condition.   Hemodynamic Findings: Central aortic pressure: 111/96 Left ventricular pressure: 115/33/37  Angiographic Findings:  Left main: Ostial 50% stenosis.   Left Anterior Descending Artery: Large caliber vessel that courses to the apex. The vessel is heavily calcified throughout the proximal, mid and distal segment. The proximal vessel has diffuse 30% stenosis. The remainder of the vessel is severely disease down to the apex. The mid vessel has diffuse 95-99% stenosis. The distal vessel has diffuse 95-99% stenosis. The mid and distal vessel is not favorable for PCI. The first diagonal branch is very small in caliber with diffuse 40% stenosis. The second diagonal branch   Circumflex Artery: Moderate caliber vessel with two obtuse marginal branches. The first obtuse marginal branch is very small in caliber and is occluded at the ostium filling from left to left collaterals. The second obtuse marginal branch is small in caliber with diffuse 40% plaque.   Right Coronary Artery:  Large dominant vessel with diffuse 30% proximal stenosis, 60% mid stenosis, 60-70% distal stenosis. The posterolateral artery is patent with diffuse 30% stenosis. The PDA is a small caliber vessel with sub-total proximal occlusion.   Left Ventricular Angiogram: Deferred.   Impression: 1. Severe triple vessel CAD with diffusely diseased mid and distal LAD, chronically occluded OM1, severe stenosis in small caliber PDA and moderate disease in the mid and distal RCA. Her LAD is not favorable for PCI given heavy calcification and  diffuse nature of disease.  2. Elevated filling pressures  Recommendations: She is admitted with hypoglycemia and found incidentally to have one abnormal troponin result with BNP of 16,000. Her troponin here has been negative. She also had renal failure at the time of admission. No chest pain. She does have severe disease in her mid and distal LAD  which involves the entire vessel which is heavily calcified. This vessel is chronically diseased and not favorable for PCI although it is not even clear that her symptoms are related to her CAD. The RCA does have several moderate stenoses but I do not think PCI of this vessel is indicated at this time. The small caliber PDA is diffusely diseased and not favorable for PCI. I would recommend medical management of this morbidly obese, elderly female's CAD. She does have elevated filling pressures and diuresis may be beneficial.        Complications:  None. The patient tolerated the procedure well.

## 2014-02-09 NOTE — Interval H&P Note (Signed)
History and Physical Interval Note:  02/09/2014 1:55 PM  [name]  has presented today for cardiac cath with the diagnosis of CAD, NSTEMI. The various methods of treatment have been discussed with the patient and family. After consideration of risks, benefits and other options for treatment, the patient has consented to  Procedure(s): LEFT HEART CATHETERIZATION WITH CORONARY ANGIOGRAM (N/A) as a surgical intervention .The patient's history has been reviewed, patient examined, no change in status, stable for surgery.  I have reviewed the patient's chart and labs.  Questions were answered to the patient's satisfaction.    Cath Lab Visit (complete for each Cath Lab visit)  Clinical Evaluation Leading to the Procedure:   ACS: Yes.    Non-ACS:    Anginal Classification: CCS III  Anti-ischemic medical therapy: Minimal Therapy (1 class of medications)  Non-Invasive Test Results: No non-invasive testing performed  Prior CABG: No previous CABG        Ellouise Mcwhirter

## 2014-02-09 NOTE — Progress Notes (Signed)
Patient Name: [name] Date of Encounter: 02/09/2014     Principal Problem:   NSTEMI (non-ST elevated myocardial infarction) Active Problems:   Fibromyalgia   Diabetes mellitus without complication   Depression    SUBJECTIVE  Her nausea has improved. No significant CP or SOB. Has mild lower chest/upper abdominal pain which seem chronic for her and she attributed it to fibromyalgia  CURRENT MEDS . antiseptic oral rinse  7 mL Mouth Rinse BID  . aspirin EC  81 mg Oral Daily  . atorvastatin  40 mg Oral q1800  . gabapentin  800 mg Oral BID  . insulin aspart  0-15 Units Subcutaneous TID WC  . metoprolol tartrate  25 mg Oral BID  . morphine  30 mg Oral Q12H  . nystatin cream   Topical BID  . pantoprazole  40 mg Oral Daily  . venlafaxine XR  37.5 mg Oral Q breakfast    OBJECTIVE  Filed Vitals:   02/08/14 1830 02/08/14 2012 02/08/14 2024 02/09/14 0600  BP: 113/68 127/67 109/53 123/59  Pulse: 68 72 79 75  Temp:  97.6 F (36.4 C)  98 F (36.7 C)  TempSrc:  Oral  Oral  Resp:  16 16 16   Height:      Weight:    264 lb 6.4 oz (119.931 kg)  SpO2: 97% 97% 97% 96%    Intake/Output Summary (Last 24 hours) at 02/09/14 0848 Last data filed at 02/09/14 0754  Gross per 24 hour  Intake      0 ml  Output    700 ml  Net   -700 ml   Filed Weights   02/08/14 0330 02/09/14 0600  Weight: 259 lb 9.6 oz (117.754 kg) 264 lb 6.4 oz (119.931 kg)    PHYSICAL EXAM  General: Pleasant, NAD. Neuro: Alert and oriented X 3. Moves all extremities spontaneously. Psych: Normal affect. HEENT:  Normal  Neck: Supple without bruits or JVD. Lungs:  Resp regular and unlabored, CTA. Heart: RRR no s3, s4, or murmurs. Abdomen: Soft, non-tender, non-distended, BS + x 4.  Extremities: No clubbing, cyanosis or edema. DP/PT/Radials 2+ and equal bilaterally.  Accessory Clinical Findings  CBC  Recent Labs  02/08/14 0700 02/09/14 0504  WBC 11.0* 9.8  HGB 14.3 14.0  HCT 47.5* 46.1*  MCV  90.5 90.4  PLT 160 423   Basic Metabolic Panel  Recent Labs  02/08/14 0700  NA 146  K 3.4*  CL 102  CO2 29  GLUCOSE 95  BUN 22  CREATININE 1.24*  CALCIUM 8.5   Liver Function Tests  Recent Labs  02/08/14 0700  AST 161*  ALT 121*  ALKPHOS 69  BILITOT 0.9  PROT 6.1  ALBUMIN 3.0*   Cardiac Enzymes  Recent Labs  02/08/14 0700 02/08/14 1104 02/08/14 1701  TROPONINI <0.30 <0.30 <0.30   Hemoglobin A1C  Recent Labs  02/08/14 0700  HGBA1C 8.9*   Fasting Lipid Panel  Recent Labs  02/08/14 0700  CHOL 128  HDL 14*  LDLCALC 87  TRIG 135  CHOLHDL 9.1    TELE  Sinus rhythm with HR 70-80s, no significant ventricular ectopy, irregular pattern this morning mainly consist of frequent PACs  ECG  NSR with PACs, HR 70s, TWI in anterior leads  Radiology/Studies  No results found.  ASSESSMENT AND PLAN  1. Nausea - ?if anginal equivalent  - troponin elevated at outside hospital, repeat serial trop negative x 3 - cath attempted on 8/12 60-70% mid and distal  RCA stenosis, unable to visualize L main and LAD via R radial artery - repeat cath today via R femoral approach, obtaining access from R radial difficult. Patient also had resp depression with severe hypoxia during procedure, will need to avoid over sedation in today's procedure  2. CAD  3. Elevated proBNP  - no prior cardiac history, obtain echo  4. Hypoglycemia  5. DM  6. HTN  7. Obesity  8. Breast CA s/p mastectomy  9. Lung CA s/p LLL resection   Signed, Woodward Ku Pager: [phone]  Attending Note:   The patient was seen and examined.  Agree with assessment and plan as noted above.  Changes made to the above note as needed.  See my note from same day.  Thayer Headings, Brooke Bonito., MD, [facility] 02/09/2014, 4:31 PM [address]  [place] Pager [phone]

## 2014-02-09 NOTE — Evaluation (Signed)
Physical Therapy Evaluation Patient Details Name: [name] MRN: [id number] DOB: [birth year] Today's Date: 02/09/2014   History of Present Illness  80 year old Caucasian female with chronic diabetes, hypertension and history of breast and lung cancer status post mastectomy and a left lower lobe resection presented to [facility] ED with hypoglycemia after increased insulin injection. She denies any chest pain, however has been nausea without vomiting for the past 3 days. Her troponin was obtained at outside hospital was elevated at 0.277, and the patient was transferred to [facility] for further evaluation  Clinical Impression  Pt presents with limited endurance, DOE, decreased balance and decreased mobility.  She was able to tolerate ambulation to the doorway, however was unable to continue due to fatigue and increased nausea with continued gait.  SaO2 dropped to 85-86% on RA during gait, but was able to increase sats to 91%.  RN made aware.  Recommend continued acute services to address deficits and recommend HHPT for follow up w/ 24/7 S/assist.  Would likely benefit from [place] [place] [facility], however pt refusing and is adamant about going home.      Follow Up Recommendations Home health PT;Supervision/Assistance - 24 hour    Equipment Recommendations  None recommended by PT    Recommendations for Other Services OT consult     Precautions / Restrictions Precautions Precautions: Fall Precaution Comments: Monitor O2 during gait, may need supplemental for ambulation Restrictions Weight Bearing Restrictions: No      Mobility  Bed Mobility Overal bed mobility: Needs Assistance Bed Mobility: Supine to Sit     Supine to sit: Mod assist     General bed mobility comments: Pt able to bring LEs off EOB, however requires increased assist for trunk, despite use of handrails to self assist.  Also note that pt tends to hold breath during transitional movements and therefore SaO2 dropped to 86% on RA, but  did increase to 93% prior to gait.   Transfers Overall transfer level: Needs assistance Equipment used: Rolling walker (2 wheeled) Transfers: Sit to/from Stand Sit to Stand: Mod assist         General transfer comment: Pt requires up to mod A to stand with mod cues for hand placement and safety.  Assist for increased forward weight shift and trunk lean.   Ambulation/Gait Ambulation/Gait assistance: Min assist Ambulation Distance (Feet): 15 Feet Assistive device: Rolling walker (2 wheeled) Gait Pattern/deviations: Step-to pattern;Decreased stride length;Shuffle;Trunk flexed;Narrow base of support     General Gait Details: Pt with very slow shuffled gait pattern.  Note that she has c/o increased nausea as she continued to ambulate.  Pt only able to make it to the door due to fatigue and nausea.  Pts SaO2 in RA fell to 85% and took at least two mins to increase to 90% with continued pursed lip breathing.   Stairs            Wheelchair Mobility    Modified Rankin (Stroke Patients Only)       Balance Overall balance assessment: Needs assistance Sitting-balance support: Feet supported;Single extremity supported Sitting balance-Leahy Scale: Fair   Postural control: Posterior lean Standing balance support: Bilateral upper extremity supported;During functional activity Standing balance-Leahy Scale: Poor Standing balance comment: Pt requires min A and use of RW to maintain standing balance.                              Pertinent Vitals/Pain Pain Assessment: 0-10 Pain Score:  5  Pain Descriptors / Indicators: Aching Pain Intervention(s): Monitored during session;Premedicated before session    Home Living Family/patient expects to be discharged to:: Private residence Living Arrangements: Spouse/significant other Available Help at Discharge: Family;Available 24 hours/day Type of Home: House Home Access: Ramped entrance     Home Layout: One level Home  Equipment: Cane - single point;Walker - 2 wheels;Shower seat Additional Comments: has a tub/shower unit but granddaughter has been assisting into/out of shower    Prior Function Level of Independence: Needs assistance   Gait / Transfers Assistance Needed: Husband ambulates with her up/down ramp  ADL's / Homemaking Assistance Needed: Granddaughter assists with bathing and dressing        Hand Dominance        Extremity/Trunk Assessment               Lower Extremity Assessment: Generalized weakness      Cervical / Trunk Assessment: Kyphotic  Communication   Communication: No difficulties  Cognition Arousal/Alertness: Awake/alert Behavior During Therapy: WFL for tasks assessed/performed Overall Cognitive Status: Within Functional Limits for tasks assessed                      General Comments      Exercises        Assessment/Plan    PT Assessment Patient needs continued PT services  PT Diagnosis Difficulty walking;Generalized weakness;Acute pain   PT Problem List Decreased strength;Decreased activity tolerance;Decreased balance;Decreased mobility;Decreased knowledge of use of DME;Cardiopulmonary status limiting activity;Obesity  PT Treatment Interventions DME instruction;Gait training;Functional mobility training;Therapeutic activities;Therapeutic exercise;Balance training;Patient/family education   PT Goals (Current goals can be found in the Care Plan section) Acute Rehab PT Goals Patient Stated Goal: to return home, no matter what PT Goal Formulation: With patient Time For Goal Achievement: 02/16/14 Potential to Achieve Goals: Good    Frequency Min 3X/week   Barriers to discharge        Co-evaluation               End of Session Equipment Utilized During Treatment: Gait belt Activity Tolerance: Patient limited by fatigue Patient left: in chair;with call bell/phone within reach Nurse Communication: Mobility status;Other (comment)  (SaO2)         Time: 1694-5038 PT Time Calculation (min): 38 min   Charges:   PT Evaluation $Initial PT Evaluation Tier I: 1 Procedure PT Treatments $Gait Training: 8-22 mins $Therapeutic Activity: 8-22 mins   PT G Codes:          Denice Bors 02/09/2014, 1:27 PM

## 2014-02-10 DIAGNOSIS — I2510 Atherosclerotic heart disease of native coronary artery without angina pectoris: Secondary | ICD-10-CM | POA: Diagnosis present

## 2014-02-10 DIAGNOSIS — I359 Nonrheumatic aortic valve disorder, unspecified: Secondary | ICD-10-CM

## 2014-02-10 LAB — BASIC METABOLIC PANEL
Anion gap: 15 (ref 5–15)
BUN: 12 mg/dL (ref 6–23)
CO2: 26 mEq/L (ref 19–32)
CREATININE: 0.84 mg/dL (ref 0.50–1.10)
Calcium: 8 mg/dL — ABNORMAL LOW (ref 8.4–10.5)
Chloride: 102 mEq/L (ref 96–112)
GFR calc Af Amer: 74 mL/min — ABNORMAL LOW (ref >90)
GFR calc non Af Amer: 64 mL/min — ABNORMAL LOW (ref >90)
Glucose, Bld: 195 mg/dL — ABNORMAL HIGH (ref 70–99)
Potassium: 5 mEq/L (ref 3.7–5.3)
Sodium: 143 mEq/L (ref 137–147)

## 2014-02-10 LAB — CBC
HCT: 47 % — ABNORMAL HIGH (ref 36.0–46.0)
Hemoglobin: 14.2 g/dL (ref 12.0–15.0)
MCH: 27.6 pg (ref 26.0–34.0)
MCHC: 30.2 g/dL (ref 30.0–36.0)
MCV: 91.4 fL (ref 78.0–100.0)
PLATELETS: 173 10*3/uL (ref 150–400)
RBC: 5.14 MIL/uL — AB (ref 3.87–5.11)
RDW: 17.4 % — ABNORMAL HIGH (ref 11.5–15.5)
WBC: 8 10*3/uL (ref 4.0–10.5)

## 2014-02-10 LAB — GLUCOSE, CAPILLARY
GLUCOSE-CAPILLARY: 185 mg/dL — AB (ref 70–99)
GLUCOSE-CAPILLARY: 190 mg/dL — AB (ref 70–99)
Glucose-Capillary: 198 mg/dL — ABNORMAL HIGH (ref 70–99)
Glucose-Capillary: 173 mg/dL — ABNORMAL HIGH (ref 70–99)

## 2014-02-10 MED ORDER — FUROSEMIDE 40 MG PO TABS
40.0000 mg | ORAL_TABLET | Freq: Every day | ORAL | Status: DC
  Administered 2014-02-11: 40 mg via ORAL
  Filled 2014-02-10 (×2): qty 1

## 2014-02-10 NOTE — Clinical Documentation Improvement (Signed)
PLEASE SPECIFY THE TYPE AND ACUITY OF CHF: Possible Clinical Conditions?  Chronic Systolic Congestive Heart Failure Chronic Diastolic Congestive Heart Failure Chronic Systolic & Diastolic Congestive Heart Failure Acute Systolic Congestive Heart Failure Acute Diastolic Congestive Heart Failure Acute Systolic & Diastolic Congestive Heart Failure Acute on Chronic Systolic Congestive Heart Failure Acute on Chronic Diastolic Congestive Heart Failure Acute on Chronic Systolic & Diastolic Congestive Heart Failure Other Condition Cannot Clinically Determine  Supporting Information: (AS PER NOTES) "Presumed congestive heart failure with elevated BNP, EF unknown."  Thank You, Alessandra Grout, RN, BSN, CCDS,Clinical Documentation Specialist:  [phone]  [phone]=[place]- Health Information Management

## 2014-02-10 NOTE — Progress Notes (Signed)
  Echocardiogram 2D Echocardiogram has been performed.  Jujuan Dugo FRANCES 02/10/2014, 2:38 PM

## 2014-02-10 NOTE — Progress Notes (Addendum)
PROGRESS NOTE  Subjective:   80 year old Caucasian female with chronic diabetes, hypertension and history of breast and lung cancer status post mastectomy and a left lower lobe resection presented to [facility] ED with hypoglycemia after increased insulin injection. She denies any chest pain, however has been nausea without vomiting for the past 3 days. Her troponin was obtained at outside hospital was elevated at 0.277, and the patient was transferred to [facility] for further evaluation  Objective:    Vital Signs:   Temp:  [97.5 F (36.4 C)-98.8 F (37.1 C)] 98.8 F (37.1 C) (08/14 0458) Pulse Rate:  [73-83] 81 (08/14 0943) Resp:  [18-20] 18 (08/14 0458) BP: (103-142)/(54-70) 121/54 mmHg (08/14 0943) SpO2:  [95 %-100 %] 100 % (08/14 0458) Weight:  [253 lb (114.76 kg)] 253 lb (114.76 kg) (08/14 0458)  Last BM Date: 02/05/14   24-hour weight change: Weight change: -11 lb 6.4 oz (-5.171 kg)  Weight trends: Filed Weights   02/08/14 0330 02/09/14 0600 02/10/14 0458  Weight: 259 lb 9.6 oz (117.754 kg) 264 lb 6.4 oz (119.931 kg) 253 lb (114.76 kg)    Intake/Output:  08/13 0701 - 08/14 0700 In: -  Out: 2250 [Urine:2250] Total I/O In: 100 [P.O.:100] Out: 700 [Urine:700]   Physical Exam: BP 121/54  Pulse 81  Temp(Src) 98.8 F (37.1 C) (Oral)  Resp 18  Ht 5\' 8"  (1.727 m)  Wt 253 lb (114.76 kg)  BMI 38.48 kg/m2  SpO2 100%  Wt Readings from Last 3 Encounters:  02/10/14 253 lb (114.76 kg)  02/10/14 253 lb (114.76 kg)  02/10/14 253 lb (114.76 kg)     General: Vital signs reviewed and noted.   Head: Normocephalic, atraumatic.  Eyes: conjunctivae/corneas clear.  EOM's intact.   Throat: normal  Neck:  normal   Lungs:    clear  Heart:  RR  Abdomen:  Soft, non-tender, non-distended    Extremities: No edema , groin is ok  Neurologic: A&O X3, CN II - XII are grossly intact.   Psych: Normal     Labs: BMET:  Recent Labs  02/09/14 1000 02/10/14 0338  NA 144  143  K 3.9 5.0  CL 101 102  CO2 29 26  GLUCOSE 153* 195*  BUN 16 12  CREATININE 0.90 0.84  CALCIUM 8.1* 8.0*    Liver function tests:  Recent Labs  02/08/14 0700  AST 161*  ALT 121*  ALKPHOS 69  BILITOT 0.9  PROT 6.1  ALBUMIN 3.0*   No results found for this basename: LIPASE, AMYLASE,  in the last 72 hours  CBC:  Recent Labs  02/09/14 0504 02/10/14 0338  WBC 9.8 8.0  HGB 14.0 14.2  HCT 46.1* 47.0*  MCV 90.4 91.4  PLT 153 173    Cardiac Enzymes:  Recent Labs  02/08/14 0700 02/08/14 1104 02/08/14 1701  TROPONINI <0.30 <0.30 <0.30    Coagulation Studies: No results found for this basename: LABPROT, INR,  in the last 72 hours  Other: No components found with this basename: POCBNP,  No results found for this basename: DDIMER,  in the last 72 hours  Recent Labs  02/08/14 0700  HGBA1C 8.9*    Recent Labs  02/08/14 0700  CHOL 128  HDL 14*  LDLCALC 87  TRIG 135  CHOLHDL 9.1   No results found for this basename: TSH, T4TOTAL, FREET3, T3FREE, THYROIDAB,  in the last 72 hours No results found for this basename: VITAMINB12, FOLATE, FERRITIN, TIBC, IRON,  RETICCTPCT,  in the last 72 hours   Other results:  EKG : NSR, ant. T wave inversion.  Medications:    Infusions:    Scheduled Medications: . antiseptic oral rinse  7 mL Mouth Rinse BID  . aspirin EC  81 mg Oral Daily  . atorvastatin  40 mg Oral q1800  . furosemide  40 mg Intravenous Q12H  . gabapentin  800 mg Oral BID  . insulin aspart  0-15 Units Subcutaneous TID WC  . metoprolol tartrate  25 mg Oral BID  . morphine  30 mg Oral Q12H  . nystatin cream   Topical BID  . pantoprazole  40 mg Oral Daily  . venlafaxine XR  37.5 mg Oral Q breakfast    Assessment/ Plan:   Principal Problem:   NSTEMI (non-ST elevated myocardial infarction) Active Problems:   Fibromyalgia   Diabetes mellitus without complication   Depression  1. Coronary artery disease: The patient has severe LAD  disease. This LAD stenosis is not amenable to PCI or coronary artery bypass grafting. In addition, she is an extremely poor candidate for coronary artery bypass grafting. She is a tight stenosis in the posterior descending artery but the vessel very small and is not a good target for any sort of revascularization.  Unfortunately we do not have any options for revascularization r option is medical therapy.  She's been started on metoprolol 25 mg twice a day. We'll at isosorbide mononitrate 81 mg a day. We'll give her nitroglycerin to take on an as-needed basis. Unfortunately that is about all we have to offer. We could consider starting her on Ranexa if the Imdur does not keep her pain free.  At some point we'll need to consider hospice and palliative care.  Anticipate that she'll go home tomorrow. We'll have the physical therapist and occupational therapist work with her today to make sure that she can get up and around safely.  2. Diabetes mellitus: Stable. We'll defer to her medical Dr.  3. Fibromyalgia: She is on numerous pain medications. She can continue to take MS Contin for her chest pain as needed.  4.  Acute diastolic congestive heart failure: class 3 .  This is likely due to her ischemia.   She was found to have significant left ventricular end-diastolic pressure elevations of 30-33 at catheterization on August 13.   She's done well on Lasix. We'll transition her to  Lasix 40 PO a day.  Her potassium levels have been fairly good. She'll need a basic metabolic profile in week or 2..  Disposition:  Length of Stay: 2  Thayer Headings, Brooke Bonito., MD, [facility] 02/10/2014, 10:11 AM Office [phone] Pager [phone]

## 2014-02-10 NOTE — Progress Notes (Signed)
Physical Therapy Treatment Patient Details Name: [name] MRN: [id number] DOB: 14-Nov-1930 Today's Date: 02/10/2014    History of Present Illness 80 year old Caucasian female with chronic diabetes, hypertension and history of breast and lung cancer status post mastectomy and a left lower lobe resection presented to [facility] ED with hypoglycemia after increased insulin injection. She denies any chest pain, however has been nausea without vomiting for the past 3 days. Her troponin was obtained at outside hospital was elevated at 0.277, and the patient was transferred to [facility] for further evaluation    PT Comments    Pt making slow but steady progress with increased gait distance today vs yesterday.  Note pt on 2L O2 when PT entered, therefore maintained 2L O2 to assess sats during mobility (note she de-sated yesterday on RA to 85%) and during transfer to bedside commode, sats decreased to 88%, therefore increased to 3L during ambulation and pt able to maintain sats in the low 90's.  Pt will likely need supplemental O2 for home use to maintain saturation.  Continue to highly recommend HHPT, however pt stating that MD states "He says I don't have to have physical therapy at home."    SATURATION QUALIFICATIONS: (This note is used to comply with regulatory documentation for home oxygen)  Patient Saturations on Room Air at Rest = 90%  Patient Saturations on Room Air while Ambulating = 85%  Patient Saturations on 3 Liters of oxygen while Ambulating = 92-93%%  Please briefly explain why patient needs home oxygen: Pt will benefit from home O2 to increase oxygen saturation.    Follow Up Recommendations  Home health PT;Supervision/Assistance - 24 hour     Equipment Recommendations  None recommended by PT    Recommendations for Other Services OT consult     Precautions / Restrictions Precautions Precautions: Fall Precaution Comments: Monitor O2 during gait, may need supplemental for  ambulation Restrictions Weight Bearing Restrictions: No    Mobility  Bed Mobility Overal bed mobility: Needs Assistance Bed Mobility: Supine to Sit     Supine to sit: Min assist;HOB elevated     General bed mobility comments: Pt better able to gets LE out of bed and scoot hips today, however continues to require min A for elevating trunk into sitting as well as HOB elevated and with handrails.   Transfers Overall transfer level: Needs assistance Equipment used: Rolling walker (2 wheeled) Transfers: Sit to/from [facility] Sit to Stand: Min assist Stand pivot transfers: Mod assist       General transfer comment: Pt with improved forward weight shift today to achieve standing, however continue to note posterior LOB requiring mod A to correct during transfer.  Performed x 2 reps to bedside commode and from bed.   Ambulation/Gait Ambulation/Gait assistance: Min assist Ambulation Distance (Feet): 35 Feet Assistive device: Rolling walker (2 wheeled) Gait Pattern/deviations: Step-through pattern;Decreased stride length;Shuffle;Trunk flexed Gait velocity: decreased   General Gait Details: Pt continues to have very slow gait speed but did tolerate increased distance today vs yesterday.  Note that she was on 2L O2 when PT entered room, therefore remained on 2L to get to bedside commode and SaO2 dropped to 88%, therefore increased to 3L during gait and pt was able to maintain sats in low 90's throughout gait.  RN made aware.    Stairs            Wheelchair Mobility    Modified Rankin (Stroke Patients Only)       Balance  Overall balance assessment: Needs assistance Sitting-balance support: Feet supported;Bilateral upper extremity supported Sitting balance-Leahy Scale: Fair   Postural control: Posterior lean Standing balance support: Bilateral upper extremity supported;During functional activity Standing balance-Leahy Scale: Poor Standing balance comment:  Requires mod A to prevent LOB posteriorly when transferring to commode seat with RW.                     Cognition Arousal/Alertness: Lethargic Behavior During Therapy: WFL for tasks assessed/performed Overall Cognitive Status: Within Functional Limits for tasks assessed                      Exercises      General Comments        Pertinent Vitals/Pain Pain Assessment: No/denies pain    Home Living                      Prior Function            PT Goals (current goals can now be found in the care plan section) Acute Rehab PT Goals Patient Stated Goal: to return home, no matter what PT Goal Formulation: With patient Time For Goal Achievement: 02/16/14 Potential to Achieve Goals: Good Progress towards PT goals: Progressing toward goals    Frequency  Min 3X/week    PT Plan Current plan remains appropriate    Co-evaluation             End of Session Equipment Utilized During Treatment: Gait belt;Oxygen Activity Tolerance: Patient limited by fatigue Patient left: in bed;with call bell/phone within reach     Time: 3614-4315 PT Time Calculation (min): 27 min  Charges:  $Gait Training: 8-22 mins $Therapeutic Activity: 8-22 mins                    G Codes:      Denice Bors 02/10/2014, 3:42 PM

## 2014-02-11 ENCOUNTER — Other Ambulatory Visit: Payer: Self-pay | Admitting: Physician Assistant

## 2014-02-11 DIAGNOSIS — I5033 Acute on chronic diastolic (congestive) heart failure: Secondary | ICD-10-CM

## 2014-02-11 DIAGNOSIS — IMO0001 Reserved for inherently not codable concepts without codable children: Secondary | ICD-10-CM

## 2014-02-11 DIAGNOSIS — E119 Type 2 diabetes mellitus without complications: Secondary | ICD-10-CM

## 2014-02-11 DIAGNOSIS — I2510 Atherosclerotic heart disease of native coronary artery without angina pectoris: Secondary | ICD-10-CM

## 2014-02-11 DIAGNOSIS — F329 Major depressive disorder, single episode, unspecified: Secondary | ICD-10-CM

## 2014-02-11 DIAGNOSIS — F3289 Other specified depressive episodes: Secondary | ICD-10-CM

## 2014-02-11 DIAGNOSIS — I5031 Acute diastolic (congestive) heart failure: Secondary | ICD-10-CM

## 2014-02-11 LAB — URINE CULTURE: Colony Count: 40000

## 2014-02-11 LAB — CBC
HCT: 45.8 % (ref 36.0–46.0)
Hemoglobin: 14 g/dL (ref 12.0–15.0)
MCH: 27.6 pg (ref 26.0–34.0)
MCHC: 30.6 g/dL (ref 30.0–36.0)
MCV: 90.2 fL (ref 78.0–100.0)
Platelets: 151 10*3/uL (ref 150–400)
RBC: 5.08 MIL/uL (ref 3.87–5.11)
RDW: 17.2 % — AB (ref 11.5–15.5)
WBC: 8.4 10*3/uL (ref 4.0–10.5)

## 2014-02-11 LAB — BASIC METABOLIC PANEL
ANION GAP: 14 (ref 5–15)
BUN: 10 mg/dL (ref 6–23)
CALCIUM: 8.9 mg/dL (ref 8.4–10.5)
CO2: 30 mEq/L (ref 19–32)
Chloride: 99 mEq/L (ref 96–112)
Creatinine, Ser: 0.87 mg/dL (ref 0.50–1.10)
GFR, EST AFRICAN AMERICAN: 71 mL/min — AB (ref >90)
GFR, EST NON AFRICAN AMERICAN: 61 mL/min — AB (ref >90)
Glucose, Bld: 186 mg/dL — ABNORMAL HIGH (ref 70–99)
Potassium: 4.5 mEq/L (ref 3.7–5.3)
SODIUM: 143 meq/L (ref 137–147)

## 2014-02-11 LAB — GLUCOSE, CAPILLARY
Glucose-Capillary: 182 mg/dL — ABNORMAL HIGH (ref 70–99)
Glucose-Capillary: 224 mg/dL — ABNORMAL HIGH (ref 70–99)

## 2014-02-11 MED ORDER — ISOSORBIDE MONONITRATE ER 30 MG PO TB24
30.0000 mg | ORAL_TABLET | Freq: Every day | ORAL | Status: DC
  Administered 2014-02-11: 30 mg via ORAL
  Filled 2014-02-11: qty 1

## 2014-02-11 MED ORDER — ISOSORBIDE MONONITRATE ER 30 MG PO TB24: 30.0000 mg | ORAL_TABLET | Freq: Every day | ORAL | Status: AC

## 2014-02-11 MED ORDER — METOPROLOL TARTRATE 25 MG PO TABS: 25.0000 mg | ORAL_TABLET | Freq: Two times a day (BID) | ORAL | Status: AC

## 2014-02-11 MED ORDER — ATORVASTATIN CALCIUM 40 MG PO TABS: 40.0000 mg | ORAL_TABLET | Freq: Every day | ORAL | Status: AC

## 2014-02-11 MED ORDER — ASPIRIN 81 MG PO TBEC: 81.0000 mg | DELAYED_RELEASE_TABLET | Freq: Every day | ORAL | Status: AC

## 2014-02-11 MED ORDER — NITROGLYCERIN 0.4 MG SL SUBL: 0.4000 mg | SUBLINGUAL_TABLET | SUBLINGUAL | Status: AC | PRN

## 2014-02-11 MED ORDER — FUROSEMIDE 40 MG PO TABS: 40.0000 mg | ORAL_TABLET | Freq: Every day | ORAL | Status: AC

## 2014-02-11 NOTE — Care Management Note (Signed)
    Page 1 of 1   02/11/2014     4:56:44 PM CARE MANAGEMENT NOTE 02/11/2014  Patient:  [name], [name]   Account Number:  [id number]  Date Initiated:  02/11/2014  Documentation initiated by:  [facility]  Subjective/Objective Assessment:   adm:     Action/Plan:   discharge planning   Anticipated DC Date:  02/11/2014   Anticipated DC Plan:        [place]  CM consult      Choice offered to / List presented to:     DME arranged  [facility]      DME agency  [facility].        Status of service:  Completed, signed off Medicare Important Message given?   (If response is "NO", the following Medicare IM given date fields will be blank) Date Medicare IM given:   Medicare IM given by:   Date Additional Medicare IM given:   Additional Medicare IM given by:    Discharge Disposition:  HOME/SELF CARE  Per UR Regulation:    If discussed at Long Length of Stay Meetings, dates discussed:    Comments:  02/11/14 16:10 CM spoke with daughter of pt, jeanna frye who is an ED RN at [facility] states they politely decline any HH as she and her family will be working with pt.  DME rep called to verify [facility] would be providing O2.  [facility] will be providing O2; request for MD to place order/F-2-F.  DME

## 2014-02-11 NOTE — Progress Notes (Signed)
  Cardiologist: Dr. Acie Fredrickson  Subjective:  No chest pain. Feels better. She has available help at home.  Objective:  Vital Signs in the last 24 hours: Temp:  [98.2 F (36.8 C)-98.6 F (37 C)] 98.2 F (36.8 C) (08/15 0500) Pulse Rate:  [76-88] 88 (08/15 1012) Resp:  [18-20] 18 (08/15 0500) BP: (99-122)/(52-69) 122/53 mmHg (08/15 1012) SpO2:  [94 %-98 %] 94 % (08/15 0500) Weight:  [246 lb 1.6 oz (111.63 kg)] 246 lb 1.6 oz (111.63 kg) (08/15 0500)  Intake/Output from previous day: 08/14 0701 - 08/15 0700 In: 370 [P.O.:370] Out: 1550 [Urine:1550]   Physical Exam: General: Well developed, well nourished, in no acute distress. Head:  Normocephalic and atraumatic. Lungs: Clear to auscultation and percussion. Heart: Normal S1 and S2.  2/6 systolic murmur right upper sternal border, no rubs or gallops.  Abdomen: soft, non-tender, positive bowel sounds. Obese Extremities: No clubbing or cyanosis. No edema. Cardiac catheterization sites intact. Neurologic: Alert and oriented x 3.    Lab Results:  Recent Labs  02/10/14 0338 02/11/14 0431  WBC 8.0 8.4  HGB 14.2 14.0  PLT 173 151    Recent Labs  02/10/14 0338 02/11/14 0431  NA 143 143  K 5.0 4.5  CL 102 99  CO2 26 30  GLUCOSE 195* 186*  BUN 12 10  CREATININE 0.84 0.87    Recent Labs  02/08/14 1701  TROPONINI <0.30    Telemetry: Sinus rhythm, occasional PACs Personally viewed.   Cardiac Studies:  Cardiac catheterization revealed three-vessel coronary artery disease. Not amenable to PCI/bypass.  Assessment/Plan:  Active Problems:   Fibromyalgia   Diabetes mellitus without complication   Depression   CAD (coronary artery disease)  80 year old with three-vessel coronary artery disease-medical management with other multiple comorbidities.  1. CAD-cardiac catheterizations reviewed. Medical management. Not a bypass candidate. Will try to optimize antianginal regimen. Continue with aspirin, metoprolol. I will and  isosorbide. Atorvastatin. If necessary in the future, can consider Ranexa. At some point we will need to consider hospice and palliative care.  2. Fibromyalgia/depression-currently on Effexor, long-acting morphine. Gabapentin.  3. Diabetes- not unreasonable to continue with medication strategy as she was on at home. Continue to monitor, primary physician.  4. Acute diastolic heart failure-exacerbated by obesity, coronary artery disease, diastolic dysfunction. Continue with Lasix 40 mg once a day. Recheck basic metabolic profile in one to 2 weeks.  5. Hypoxemia-physical therapy noted oxygen saturations of 85% with activity. She will need home oxygen. She will need physical therapy as well.  Please have her followup with Dr. Acie Fredrickson or APP in 1-2 weeks. Okay for discharge.  Haili Donofrio, [place] 02/11/2014, 11:21 AM

## 2014-02-11 NOTE — Discharge Summary (Signed)
Physician Discharge Summary     Patient ID: [name] MRN: [id number] DOB/AGE: [birth year] 80 y.o.  Admit date: 02/08/2014 Discharge date: 02/15/2014  Admission Diagnoses:  Hypoglycemia  Discharge Diagnoses:  Active Problems:   Fibromyalgia   Diabetes mellitus without complication   Depression   CAD (coronary artery disease)   Hypoglycemia  Discharged Condition: stable  Hospital Course:   80 yo female with PMH sig for DM2, HTN, obesity, h/o lung CA s/p resection, h/o breast CA s/p mastectomy presenting initially to [facility] after being found to be hypoglycemic after increasing her insulin due to elevated CBG. Normally takes 30 units 70/30 but due to an elevated gluocose of 400, she took at additional 30 units.  She presented to [facility] and was reportedly slightly altered and found to have a CBG of 47. Given D50 and CBG improved to 80s and mentation improved.  Workup there included a screening troponin which was positive at 0.3. Due to this she was transferred to [facility]. Prior to transport, she was given aspirin 324 mg.   She states that she has not had any chest pain. However, for the past 2-3 days, she has felt nauseated but no vomiting. Poor apetite and dark urine as well. Mildly short of breath with the nausea. No PND or orthopnea. Limited mobility due to chronic pain.  ROS endorses constipation, last BM 2-3 days ago.  No known previous heart conditions though previously has been long acting nitrates. EKG demonstrates sinus rhythm, diffuse TWI, no comparison. Acute abdomen series at [facility]: nonspec nonobs bowel gas pattern with dilation, possible mild ileus chronic elevation of L hemidiaphragm.  Patient was admitted with concerns for an NSTEMI. She was continued on aspirin and heparin drip was started. Beta blocker was changed to metoprolol and statin was added.  Cardiac catheterization that afternoon revealed borderline significant disease in the mid and distal RCA  with subtotal occlusion of a medium-sized right PDA.  Left Anterior Descending is heavily calcified but not well visualized with nonselective injection.  The patient went back to the lab for catheterization via the right femoral artery due to inability to cannulate the left main coronary artery via the right radial artery in spite of multiple attempts and using different catheters.  The procedure revealed severe triple vessel CAD with diffusely diseased mid and distal LAD, chronically occluded OM1, severe stenosis in small caliber PDA and moderate disease in the mid and distal RCA. Her LAD is not favorable for PCI given heavy calcification and diffuse nature of disease.  Elevated filling pressures.  She is a poor candidate for CABG.  Lopressor 25mg  BID, imdur 30mg , ASA were added.  Consider adding Ranexa She was started on lasix due to the severely elevated LVEDP and diuresed 3.5L.  She was transitioned to Lasix 40 PO a day. Her potassium levels have been fairly good. She'll get a basic metabolic profile in week or 2..  The patient was seen by Dr. Marlou Porch who felt she was stable for DC home with Home O2 due to O2 sats at 85% with activity.  Home Health PT also.    Consults: None  Significant Diagnostic Studies:  Cardiac Catheterization Operative Report  [name]  [id number]  8/13/20152:45 PM  No primary provider on file.  Procedure Performed:  1. Left Heart Catheterization 2. Selective Coronary Angiography Operator: Lauree Chandler, MD  Indication: 80 yo female with morbid obesity, DM, HTN, h/o lung CA s/p resection, h/o breast CA s/p mastectomy presenting  initially to [facility] after being found to be hypoglycemic after increasing her insulin due to elevated CBG. Workup there included a screening troponin which was positive at 0.3. Due to this she was transferred to [facility]. She has had no chest pain. Her troponin here has been negative. Cardiac cath was attempted on 02/08/14 by Dr.  Fletcher Anon from the right radial artery. He was unable to engage the left main with multiple catheters. He did visualize the RCA. She became unresponsive in the cath lab and sedation had to be reversed. She was anticoagulated so the procedure was aborted. I am asked today to perform a left heart cath from the right groin. As above, pt has not chest pain. She only c/o nausea.  Procedure Details:  The risks, benefits, complications, treatment options, and expected outcomes were discussed with the patient. The patient and/or family concurred with the proposed plan, giving informed consent. The patient was brought to the cath lab after IV hydration was begun and oral premedication was given. The patient was further sedated with Versed and Fentanyl. The right groin was prepped and draped in the usual manner. Using the modified Seldinger access technique, a 5 French sheath was placed in the right femoral artery. Standard diagnostic catheters were used to perform selective coronary angiography. An AL-2 catheter and a long straight wire was used to cross the aortic valve. LV pressures obtained. Angioseal placed right femoral artery.  There were no immediate complications. The patient was taken to the recovery area in stable condition.  Hemodynamic Findings:  Central aortic pressure: 111/96  Left ventricular pressure: 115/33/37  Angiographic Findings:  Left main: Ostial 50% stenosis.  Left Anterior Descending Artery: Large caliber vessel that courses to the apex. The vessel is heavily calcified throughout the proximal, mid and distal segment. The proximal vessel has diffuse 30% stenosis. The remainder of the vessel is severely disease down to the apex. The mid vessel has diffuse 95-99% stenosis. The distal vessel has diffuse 95-99% stenosis. The mid and distal vessel is not favorable for PCI. The first diagonal branch is very small in caliber with diffuse 40% stenosis. The second diagonal branch  Circumflex Artery:  Moderate caliber vessel with two obtuse marginal branches. The first obtuse marginal branch is very small in caliber and is occluded at the ostium filling from left to left collaterals. The second obtuse marginal branch is small in caliber with diffuse 40% plaque.  Right Coronary Artery: Large dominant vessel with diffuse 30% proximal stenosis, 60% mid stenosis, 60-70% distal stenosis. The posterolateral artery is patent with diffuse 30% stenosis. The PDA is a small caliber vessel with sub-total proximal occlusion.  Left Ventricular Angiogram: Deferred.  Impression:  1. Severe triple vessel CAD with diffusely diseased mid and distal LAD, chronically occluded OM1, severe stenosis in small caliber PDA and moderate disease in the mid and distal RCA. Her LAD is not favorable for PCI given heavy calcification and diffuse nature of disease.  2. Elevated filling pressures  Recommendations: She is admitted with hypoglycemia and found incidentally to have one abnormal troponin result with BNP of 16,000. Her troponin here has been negative. She also had renal failure at the time of admission. No chest pain. She does have severe disease in her mid and distal LAD which involves the entire vessel which is heavily calcified. This vessel is chronically diseased and not favorable for PCI although it is not even clear that her symptoms are related to her CAD. The RCA does have several  moderate stenoses but I do not think PCI of this vessel is indicated at this time. The small caliber PDA is diffusely diseased and not favorable for PCI. I would recommend medical management of this morbidly obese, elderly female's CAD. She does have elevated filling pressures and diuresis may be beneficial.  Complications: None. The patient tolerated the procedure well.    Cardiac Catheterization Procedure Note  Name: [name]  MRN: [id number]  DOB: [birth year]  Procedure: Selective Coronary Angiography, incomplete study due to  inability to cannulate the left main coronary artery via the right radial artery.  Indication: Non-ST elevation myocardial infarction  Medications:  Sedation: 1 mg IV Versed, 25 mcg IV Fentanyl. Versed was reversed with flumazenil 0.2 mg times one due to respiratory depression.  Contrast: 130 ml Omnipaque  Procedural Details: The right wrist was prepped, draped, and anesthetized with 1% lidocaine. arterial access was difficult due to weakness. I used ultrasound guidance. Using the modified Seldinger technique, a 5 French sheath was introduced into the right radial artery. 3 mg of verapamil was administered through the sheath, weight-based unfractionated heparin was administered intravenously. A Jackie catheter was used for selective coronary angiography. I could not selectively engage the coronary arteries with this. A JR 4 catheter was used to engage the right coronary artery. The left main coronary artery could not be engaged in spite of multiple catheters including the Jackie, TIG, JL 3.5, a.l. 1 and 2 different guides including JL 3 and Ikari left 3.5. The procedure was thus aborted. There were no immediate procedural complications. A TR band was used for radial hemostasis at the completion of the procedure. The patient was transferred to the post catheterization recovery area for further monitoring.  Procedural Findings:  Hemodynamics:  AO: 126/64 MmHg  Coronary angiography:  Coronary dominance: Right  Left Main: Appears to be free of significant disease.  Left Anterior Descending (LAD): Heavily calcified but not well visualized with nonselective injection.  Right Coronary Artery: Large in size and dominant. The vessel is moderately calcified. There is 20% proximal stenosis. There is 60-70% mid stenosis and 60-70% distal stenosis. There is significant disease involving the second RV marginal.  Posterior descending artery: Subtotally occluded with diffuse disease in the proximal and midsegment. The  vessel is medium in size.  Posterior AV segment: Normal in size with minor irregularities.  Left ventriculography: Not performed  Final Conclusions:  1. Inability to cannulate the left main coronary artery via the right radial artery in spite of multiple attempts and using different catheters.  2. There is borderline significant disease in the mid and distal RCA with subtotal occlusion of a medium size right PDA.  Recommendations:  Recommend cardiac catheterization via the right femoral artery tomorrow. The left radial pulse was weak. Obtaining access via the right radial artery was difficult. Ultrasound was used.  I elected not to obtain access via the right femoral artery today given that she was fully anticoagulated . I am also going to hydrate her overnight.  The patient developed respiratory depression with severe hypoxia after she was given 1 mg of Versed and 25 mcg of Fentanyl. She responded to Flumazenil and Oxygen. Avoid over sedation for future procedures.  Kathlyn Sacramento MD, [facility]  02/08/2014, 4:26 PM    Treatments: See above  Discharge Exam: Blood pressure 117/54, pulse 77, temperature 98.2 F (36.8 C), temperature source Oral, resp. rate 20, height 5\' 8"  (1.727 m), weight 246 lb 1.6 oz (111.63 kg), SpO2 97.00%.   Disposition:  01-Home or Self Care      Discharge Instructions   Diet - low sodium heart healthy    Complete by:  As directed      Discharge instructions    Complete by:  As directed   Weight yourself every morning.  If you gain 3 pounds in 24 hours, or 5 pounds in a week, call the office for instructions.  Also, if your weight decreases by 5 pounds, call the office.     Increase activity slowly    Complete by:  As directed             Medication List    STOP taking these medications       atenolol 25 MG tablet  Commonly known as:  TENORMIN      TAKE these medications       aspirin 81 MG EC tablet  Take 1 tablet (81 mg total) by mouth daily.      atorvastatin 40 MG tablet  Commonly known as:  LIPITOR  Take 1 tablet (40 mg total) by mouth daily at 6 PM.     cholecalciferol 1000 UNITS tablet  Commonly known as:  VITAMIN D  Take 1,000 Units by mouth every morning.     desvenlafaxine 50 MG 24 hr tablet  Commonly known as:  PRISTIQ  Take 50 mg by mouth every evening.     furosemide 40 MG tablet  Commonly known as:  LASIX  Take 1 tablet (40 mg total) by mouth daily.     gabapentin 800 MG tablet  Commonly known as:  NEURONTIN  Take 800 mg by mouth 3 (three) times daily.     HYDROcodone-acetaminophen 7.5-325 MG per tablet  Commonly known as:  NORCO  Take 0.5 tablets by mouth every 6 (six) hours as needed (breakthrough pain).     insulin lispro protamine-lispro (75-25) 100 UNIT/ML Susp injection  Commonly known as:  HUMALOG 75/25 MIX  Inject 30 Units into the skin 2 (two) times daily with a meal.     isosorbide mononitrate 30 MG 24 hr tablet  Commonly known as:  IMDUR  Take 1 tablet (30 mg total) by mouth daily.     metoprolol tartrate 25 MG tablet  Commonly known as:  LOPRESSOR  Take 1 tablet (25 mg total) by mouth 2 (two) times daily.     morphine 30 MG 12 hr tablet  Commonly known as:  MS CONTIN  Take 30 mg by mouth every 12 (twelve) hours.     nitroGLYCERIN 0.4 MG SL tablet  Commonly known as:  NITROSTAT  Place 1 tablet (0.4 mg total) under the tongue every 5 (five) minutes x 3 doses as needed for chest pain.     nystatin-triamcinolone ointment  Commonly known as:  MYCOLOG  Apply 1 application topically 2 (two) times daily. Apply to groin area     ranitidine 150 MG capsule  Commonly known as:  ZANTAC  Take 150 mg by mouth 2 (two) times daily as needed for heartburn.       Follow-up Information   Follow up with Darden Amber., MD. (The office will call you to make an appointment for 1-2 weeks. If you do not hear from them by Tuesday please contact them)    Specialty:  Cardiology   Contact information:    [address] [place] [address] [phone]       Follow up with LABS On 02/23/2014. (Have lab draw in the  office lab. )    Contact information:   [address] [place] [address] [phone]     Greater than 30 minutes was spent completing the patient's discharge.   SignedTarri Fuller, [place] 02/15/2014, 3:06 PM

## 2014-02-11 NOTE — Progress Notes (Signed)
Physical Therapy Treatment Patient Details Name: [name] MRN: [id number] DOB: March 03, 1931 Today's Date: 02/11/2014    History of Present Illness 80 year old Caucasian female with chronic diabetes, hypertension and history of breast and lung cancer status post mastectomy and a left lower lobe resection presented to [facility] ED with hypoglycemia after increased insulin injection. She denies any chest pain, however has been nausea without vomiting for the past 3 days. Her troponin was obtained at outside hospital was elevated at 0.277, and the patient was transferred to [facility] for further evaluation  Pt. found to have NSTEMI, underwent cardiac cath    PT Comments    Pt. Remains with generalized weakness and deconditioning.  She will  Have 24 hour assist at home.  Daughter arrived at end of session. She wanted update on her mom's progress .  I informed her of pt's dropping O2 sats on room air and that pt. Will likely be going home with home O2 at time of DC.  Also instructed her that pt. Needs min assist for walking/functional mobility  Follow Up Recommendations  Home health PT;Supervision/Assistance - 24 hour     Equipment Recommendations  None recommended by PT    Recommendations for Other Services OT consult     Precautions / Restrictions Precautions Precautions: Fall Precaution Comments: monitor O2 during gait Restrictions Weight Bearing Restrictions: No    Mobility  Bed Mobility Overal bed mobility: Needs Assistance Bed Mobility: Supine to Sit     Supine to sit: Min assist     General bed mobility comments: min assist for elevating trunk into sitting position  Transfers Overall transfer level: Needs assistance Equipment used: Rolling walker (2 wheeled) Transfers: Sit to/from Stand Sit to Stand: Min assist         General transfer comment: pt. continues with posterior tendancy, tends to try to pull self forward with RW.  Pt. educated on need to use at least  one hand to push self up off bed.  Needed min assist to facititate power up  Ambulation/Gait Ambulation/Gait assistance: Min assist Ambulation Distance (Feet): 30 Feet (15' x 2) Assistive device: Rolling walker (2 wheeled) Gait Pattern/deviations: Step-through pattern;Decreased stride length;Shuffle;Trunk flexed Gait velocity: decreased   General Gait Details: Pt. continues to need min assist for safety and stability in ambulation.  tolerated walking to bathroom and back across room.  Trialed on room air with sats drpooing to 81%, O2 applied at 2 L min.  See vitals below .     Stairs            Wheelchair Mobility    Modified Rankin (Stroke Patients Only)       Balance                                    Cognition Arousal/Alertness: Awake/alert Behavior During Therapy: WFL for tasks assessed/performed Overall Cognitive Status: Within Functional Limits for tasks assessed                      Exercises      General Comments        Pertinent Vitals/Pain Pain Assessment: No/denies pain O2 at rest on 1.5  L O2  96% O2 with activity on room air 81% O2 with activity on 2 L O2 93% O2 at end of activity on 2 L O2  94%    Home Living  Prior Function            PT Goals (current goals can now be found in the care plan section) Progress towards PT goals: Progressing toward goals    Frequency  Min 3X/week    PT Plan Current plan remains appropriate    Co-evaluation             End of Session Equipment Utilized During Treatment: Gait belt;Oxygen Activity Tolerance: Patient limited by fatigue Patient left: in chair;with call bell/phone within reach;with family/visitor present     Time: 1134-1205 PT Time Calculation (min): 31 min  Charges:  $Gait Training: 23-37 mins                    G Codes:      Ladona Ridgel 02/11/2014, 12:18 PM Gerlean Ren PT Acute Rehab Services  [place] [phone]

## 2014-02-15 NOTE — Discharge Summary (Signed)
Personally seen and examined. Agree with above. SKAINS, MARK, MD  

## 2014-03-03 ENCOUNTER — Ambulatory Visit (INDEPENDENT_AMBULATORY_CARE_PROVIDER_SITE_OTHER): Payer: Medicare Other | Admitting: Cardiovascular Disease

## 2014-03-03 ENCOUNTER — Encounter: Payer: Self-pay | Admitting: Cardiovascular Disease

## 2014-03-03 VITALS — BP 110/58 | HR 89 | Ht 68.0 in | Wt 246.8 lb

## 2014-03-03 DIAGNOSIS — I2510 Atherosclerotic heart disease of native coronary artery without angina pectoris: Secondary | ICD-10-CM

## 2014-03-03 DIAGNOSIS — R0602 Shortness of breath: Secondary | ICD-10-CM

## 2014-03-03 NOTE — Patient Instructions (Signed)
Your physician wants you to follow-up in:  6 months. You will receive a reminder letter in the mail two months in advance. If you don't receive a letter, please call our office to schedule the follow-up appointment.   

## 2014-03-03 NOTE — Progress Notes (Addendum)
[name] 80 y.o. [id number]  problem list 1. Diabetes mellitus 2. Hypertension 3. Breast cancer 4. Lung cancer . Coronary artery disease- severe CAD by cardiac catheterization in August, 2015 which is not amenable to coronary artery bypass grafting medical therapy has been recommended.  Left main: Ostial 50% stenosis.  Left Anterior Descending Artery: Large caliber vessel that courses to the apex. The vessel is heavily calcified throughout the proximal, mid and distal segment. The proximal vessel has diffuse 30% stenosis. The remainder of the vessel is severely disease down to the apex. The mid vessel has diffuse 95-99% stenosis. The distal vessel has diffuse 95-99% stenosis. The mid and distal vessel is not favorable for PCI. The first diagonal branch is very small in caliber with diffuse 40% stenosis. The second diagonal branch  Circumflex Artery: Moderate caliber vessel with two obtuse marginal branches. The first obtuse marginal branch is very small in caliber and is occluded at the ostium filling from left to left collaterals. The second obtuse marginal branch is small in caliber with diffuse 40% plaque.  Right Coronary Artery: Large dominant vessel with diffuse 30% proximal stenosis, 60% mid stenosis, 60-70% distal stenosis. The posterolateral artery is patent with diffuse 30% stenosis. The PDA is a small caliber vessel with sub-total proximal occlusion  [name] is an 20-year-old female who I met during her hospitalization in August, 2015. She presented to [facility] emergency room with nausea and vomiting and mildly elevated troponin level. She had a cardiac catheterization which revealed severe LAD disease which was not amenable to PCI. Medical therapy was recommended.  Current Outpatient Prescriptions on File Prior to Visit  Medication Sig Dispense Refill  . aspirin EC 81 MG EC tablet Take 1 tablet (81 mg total) by mouth daily.      Marland Kitchen atorvastatin (LIPITOR) 40 MG tablet Take  1 tablet (40 mg total) by mouth daily at 6 PM.  30 tablet  5  . cholecalciferol (VITAMIN D) 1000 UNITS tablet Take 1,000 Units by mouth every morning.      . desvenlafaxine (PRISTIQ) 50 MG 24 hr tablet Take 50 mg by mouth every evening.      . furosemide (LASIX) 40 MG tablet Take 1 tablet (40 mg total) by mouth daily.  30 tablet  5  . gabapentin (NEURONTIN) 800 MG tablet Take 800 mg by mouth 3 (three) times daily.      Marland Kitchen HYDROcodone-acetaminophen (NORCO) 7.5-325 MG per tablet Take 0.5 tablets by mouth every 6 (six) hours as needed (breakthrough pain).      . insulin lispro protamine-lispro (HUMALOG 75/25 MIX) (75-25) 100 UNIT/ML SUSP injection Inject 30 Units into the skin 2 (two) times daily with a meal.      . isosorbide mononitrate (IMDUR) 30 MG 24 hr tablet Take 1 tablet (30 mg total) by mouth daily.  30 tablet  5  . metoprolol tartrate (LOPRESSOR) 25 MG tablet Take 1 tablet (25 mg total) by mouth 2 (two) times daily.  60 tablet  5  . morphine (MS CONTIN) 30 MG 12 hr tablet Take 30 mg by mouth every 12 (twelve) hours.      . nitroGLYCERIN (NITROSTAT) 0.4 MG SL tablet Place 1 tablet (0.4 mg total) under the tongue every 5 (five) minutes x 3 doses as needed for chest pain.  25 tablet  12  . nystatin-triamcinolone ointment (MYCOLOG) Apply 1 application topically 2 (two) times daily. Apply to groin area      . ranitidine (ZANTAC) 150  MG capsule Take 150 mg by mouth 2 (two) times daily as needed for heartburn.       No current facility-administered medications on file prior to visit.    Past Medical History  Diagnosis Date  . Fibromyalgia   . Arthritis   . Diabetes mellitus without complication   . Depression   . Coronary artery disease   . Hypertension   . Hyperlipidemia   . Breast cancer   . Mass of lower lobe of left lung      Family History  Problem Relation Age of Onset  . Family history unknown: Yes   History   Social History  . Marital Status: Married    Spouse Name: N/A      Number of Children: N/A  . Years of Education: N/A   Occupational History  . Not on file.   Social History Main Topics  . Smoking status: Former Smoker -- 0.25 packs/day for 15 years    Types: Cigarettes  . Smokeless tobacco: Never Used  . Alcohol Use: No  . Drug Use: No  . Sexual Activity: Not Currently   Other Topics Concern  . Not on file   Social History Narrative  . No narrative on file    Physical Exam:  BP 110/58  Pulse 89  Ht 5' 8"  (1.727 m)  Wt 246 lb 12 oz (111.925 kg)  BMI 37.53 kg/m2  HEENT:  Normal,  No JVD, normal carotids. Heart: RRR, no murmur  Lung: mostly clear General: frail appearing,   ECG:  NSR at 89 NS ST abnormalities.                                                    Assessment:

## 2014-03-03 NOTE — Assessment & Plan Note (Signed)
[name] is an 80 year old female with diabetes, hypertension, and severe inoperable coronary artery disease. Her angina equivalent appears to be nausea. She does not really report any chest pain.  At this point she seems to be fairly well but does have frequent episodes of nausea. I have encouraged her to take nitroglycerin if she has these episodes of nausea. Her blood pressure and heart rate are such that I do not think that we can increase any of her medications. She already is having some wheezing and I do not think that we would deal to increase her metoprolol at this point.  I will see her in 6 months.

## 2014-03-10 ENCOUNTER — Telehealth: Payer: Self-pay | Admitting: *Deleted

## 2014-03-10 NOTE — Telephone Encounter (Signed)
Patients daughter called and stated: Patient had a period of extreme confusion yesterday afternoon  She did not know where or when she was  This lasted for several hours and resolved  Patient is back to normal today   She stated she is contacting the patients PCP today but wanted Dr. Acie Fredrickson to know

## 2014-03-12 NOTE — Telephone Encounter (Signed)
This may have been a TIA.  If she has any symptoms, she should go to the ER

## 2014-03-13 NOTE — Telephone Encounter (Signed)
Informed patient and daughter of Dr. Lanny Hurst response  They verbalized understanding

## 2014-06-08 ENCOUNTER — Encounter (HOSPITAL_COMMUNITY): Payer: Self-pay | Admitting: Cardiovascular Disease

## 2015-05-31 DEATH — deceased
